# Patient Record
Sex: Female | Born: 1945 | Race: White | Hispanic: No
Health system: Southern US, Community
[De-identification: ages and names within clinical notes are randomized; demographics above are authoritative.]

---

## 2021-10-07 ENCOUNTER — Other Ambulatory Visit: Payer: Self-pay

## 2021-10-07 ENCOUNTER — Encounter (HOSPITAL_COMMUNITY): Payer: Self-pay | Admitting: Emergency Medicine

## 2021-10-07 ENCOUNTER — Emergency Department (HOSPITAL_COMMUNITY): Payer: Medicare Other

## 2021-10-07 ENCOUNTER — Inpatient Hospital Stay (HOSPITAL_COMMUNITY): Payer: Medicare Other

## 2021-10-07 ENCOUNTER — Inpatient Hospital Stay (HOSPITAL_COMMUNITY)
Admission: EM | Admit: 2021-10-07 | Discharge: 2021-10-09 | DRG: 880 | Disposition: A | Discharge status: Home / Self Care | Payer: Medicare Other | Attending: Internal Medicine | Admitting: Internal Medicine

## 2021-10-07 DIAGNOSIS — Z8673 Personal history of transient ischemic attack (TIA), and cerebral infarction without residual deficits: Secondary | ICD-10-CM | POA: Diagnosis not present

## 2021-10-07 DIAGNOSIS — Z91013 Allergy to seafood: Secondary | ICD-10-CM

## 2021-10-07 DIAGNOSIS — Z91030 Bee allergy status: Secondary | ICD-10-CM | POA: Diagnosis not present

## 2021-10-07 DIAGNOSIS — Z87898 Personal history of other specified conditions: Secondary | ICD-10-CM

## 2021-10-07 DIAGNOSIS — K219 Gastro-esophageal reflux disease without esophagitis: Secondary | ICD-10-CM

## 2021-10-07 DIAGNOSIS — Z79899 Other long term (current) drug therapy: Secondary | ICD-10-CM | POA: Diagnosis not present

## 2021-10-07 DIAGNOSIS — I10 Essential (primary) hypertension: Secondary | ICD-10-CM | POA: Diagnosis present

## 2021-10-07 DIAGNOSIS — R55 Syncope and collapse: Secondary | ICD-10-CM | POA: Diagnosis present

## 2021-10-07 DIAGNOSIS — Z881 Allergy status to other antibiotic agents status: Secondary | ICD-10-CM | POA: Diagnosis not present

## 2021-10-07 DIAGNOSIS — E785 Hyperlipidemia, unspecified: Secondary | ICD-10-CM | POA: Diagnosis present

## 2021-10-07 DIAGNOSIS — Z20822 Contact with and (suspected) exposure to covid-19: Secondary | ICD-10-CM | POA: Diagnosis present

## 2021-10-07 DIAGNOSIS — K7460 Unspecified cirrhosis of liver: Secondary | ICD-10-CM | POA: Diagnosis present

## 2021-10-07 DIAGNOSIS — R4182 Altered mental status, unspecified: Principal | ICD-10-CM

## 2021-10-07 DIAGNOSIS — F449 Dissociative and conversion disorder, unspecified: Secondary | ICD-10-CM | POA: Diagnosis not present

## 2021-10-07 DIAGNOSIS — E039 Hypothyroidism, unspecified: Secondary | ICD-10-CM

## 2021-10-07 DIAGNOSIS — E119 Type 2 diabetes mellitus without complications: Secondary | ICD-10-CM | POA: Diagnosis present

## 2021-10-07 DIAGNOSIS — F445 Conversion disorder with seizures or convulsions: Principal | ICD-10-CM

## 2021-10-07 DIAGNOSIS — Z7984 Long term (current) use of oral hypoglycemic drugs: Secondary | ICD-10-CM | POA: Diagnosis not present

## 2021-10-07 DIAGNOSIS — G9341 Metabolic encephalopathy: Secondary | ICD-10-CM

## 2021-10-07 DIAGNOSIS — R569 Unspecified convulsions: Secondary | ICD-10-CM

## 2021-10-07 DIAGNOSIS — I2510 Atherosclerotic heart disease of native coronary artery without angina pectoris: Secondary | ICD-10-CM | POA: Diagnosis present

## 2021-10-07 DIAGNOSIS — Z9861 Coronary angioplasty status: Secondary | ICD-10-CM | POA: Diagnosis not present

## 2021-10-07 DIAGNOSIS — H409 Unspecified glaucoma: Secondary | ICD-10-CM | POA: Diagnosis present

## 2021-10-07 DIAGNOSIS — Z66 Do not resuscitate: Secondary | ICD-10-CM | POA: Diagnosis present

## 2021-10-07 DIAGNOSIS — F4321 Adjustment disorder with depressed mood: Secondary | ICD-10-CM | POA: Diagnosis not present

## 2021-10-07 DIAGNOSIS — J449 Chronic obstructive pulmonary disease, unspecified: Secondary | ICD-10-CM

## 2021-10-07 DIAGNOSIS — R441 Visual hallucinations: Secondary | ICD-10-CM | POA: Diagnosis not present

## 2021-10-07 DIAGNOSIS — Z86711 Personal history of pulmonary embolism: Secondary | ICD-10-CM

## 2021-10-07 DIAGNOSIS — Z794 Long term (current) use of insulin: Secondary | ICD-10-CM | POA: Diagnosis not present

## 2021-10-07 DIAGNOSIS — F319 Bipolar disorder, unspecified: Secondary | ICD-10-CM | POA: Diagnosis present

## 2021-10-07 HISTORY — DX: Essential (primary) hypertension: I10

## 2021-10-07 HISTORY — DX: Bipolar disorder, unspecified: F31.9

## 2021-10-07 HISTORY — DX: Chronic obstructive pulmonary disease, unspecified: J44.9

## 2021-10-07 HISTORY — DX: Unspecified cirrhosis of liver: K74.60

## 2021-10-07 HISTORY — DX: Personal history of pulmonary embolism: Z86.711

## 2021-10-07 HISTORY — DX: Hypothyroidism, unspecified: E03.9

## 2021-10-07 HISTORY — DX: Unspecified glaucoma: H40.9

## 2021-10-07 LAB — COMPREHENSIVE METABOLIC PANEL
ALT: 17 U/L (ref 0–44)
AST: 28 U/L (ref 15–41)
Albumin: 3.4 g/dL — ABNORMAL LOW (ref 3.5–5.0)
Alkaline Phosphatase: 65 U/L (ref 38–126)
Anion gap: 9 (ref 5–15)
BUN: 16 mg/dL (ref 8–23)
CO2: 25 mmol/L (ref 22–32)
Calcium: 9.2 mg/dL (ref 8.9–10.3)
Chloride: 107 mmol/L (ref 98–111)
Creatinine, Ser: 1.15 mg/dL — ABNORMAL HIGH (ref 0.44–1.00)
GFR, Estimated: 50 mL/min — ABNORMAL LOW (ref >60)
Glucose, Bld: 102 mg/dL — ABNORMAL HIGH (ref 70–99)
Potassium: 3.8 mmol/L (ref 3.5–5.1)
Sodium: 141 mmol/L (ref 135–145)
Total Bilirubin: 0.4 mg/dL (ref 0.3–1.2)
Total Protein: 6.6 g/dL (ref 6.5–8.1)

## 2021-10-07 LAB — GLUCOSE, CAPILLARY: Glucose-Capillary: 78 mg/dL (ref 70–99)

## 2021-10-07 LAB — URINALYSIS, ROUTINE W REFLEX MICROSCOPIC
Bilirubin Urine: NEGATIVE
Glucose, UA: 150 mg/dL — AB
Hgb urine dipstick: NEGATIVE
Ketones, ur: NEGATIVE mg/dL
Leukocytes,Ua: NEGATIVE
Nitrite: NEGATIVE
Protein, ur: NEGATIVE mg/dL
Specific Gravity, Urine: 1.009 (ref 1.005–1.030)
pH: 6 (ref 5.0–8.0)

## 2021-10-07 LAB — CBC WITH DIFFERENTIAL/PLATELET
Abs Immature Granulocytes: 0.03 10*3/uL (ref 0.00–0.07)
Basophils Absolute: 0 10*3/uL (ref 0.0–0.1)
Basophils Relative: 1 %
Eosinophils Absolute: 0.1 10*3/uL (ref 0.0–0.5)
Eosinophils Relative: 2 %
HCT: 38.2 % (ref 36.0–46.0)
Hemoglobin: 12.1 g/dL (ref 12.0–15.0)
Immature Granulocytes: 1 %
Lymphocytes Relative: 31 %
Lymphs Abs: 1.9 10*3/uL (ref 0.7–4.0)
MCH: 28.1 pg (ref 26.0–34.0)
MCHC: 31.7 g/dL (ref 30.0–36.0)
MCV: 88.8 fL (ref 80.0–100.0)
Monocytes Absolute: 0.3 10*3/uL (ref 0.1–1.0)
Monocytes Relative: 5 %
Neutro Abs: 3.9 10*3/uL (ref 1.7–7.7)
Neutrophils Relative %: 60 %
Platelets: 134 10*3/uL — ABNORMAL LOW (ref 150–400)
RBC: 4.3 MIL/uL (ref 3.87–5.11)
RDW: 14.8 % (ref 11.5–15.5)
WBC: 6.3 10*3/uL (ref 4.0–10.5)
nRBC: 0 % (ref 0.0–0.2)

## 2021-10-07 LAB — I-STAT VENOUS BLOOD GAS, ED
Acid-Base Excess: 2 mmol/L (ref 0.0–2.0)
Bicarbonate: 27.5 mmol/L (ref 20.0–28.0)
Calcium, Ion: 1.15 mmol/L (ref 1.15–1.40)
HCT: 40 % (ref 36.0–46.0)
Hemoglobin: 13.6 g/dL (ref 12.0–15.0)
O2 Saturation: 64 %
Potassium: 4.1 mmol/L (ref 3.5–5.1)
Sodium: 139 mmol/L (ref 135–145)
TCO2: 29 mmol/L (ref 22–32)
pCO2, Ven: 45.8 mmHg (ref 44–60)
pH, Ven: 7.386 (ref 7.25–7.43)
pO2, Ven: 34 mmHg (ref 32–45)

## 2021-10-07 LAB — RAPID URINE DRUG SCREEN, HOSP PERFORMED
Amphetamines: NOT DETECTED
Barbiturates: NOT DETECTED
Benzodiazepines: POSITIVE — AB
Cocaine: NOT DETECTED
Opiates: NOT DETECTED
Tetrahydrocannabinol: NOT DETECTED

## 2021-10-07 LAB — TROPONIN I (HIGH SENSITIVITY)
Troponin I (High Sensitivity): 5 ng/L (ref <18)
Troponin I (High Sensitivity): 7 ng/L (ref <18)

## 2021-10-07 LAB — CBG MONITORING, ED
Glucose-Capillary: 111 mg/dL — ABNORMAL HIGH (ref 70–99)
Glucose-Capillary: 145 mg/dL — ABNORMAL HIGH (ref 70–99)
Glucose-Capillary: 42 mg/dL — CL (ref 70–99)

## 2021-10-07 LAB — VITAMIN B12: Vitamin B-12: 250 pg/mL (ref 180–914)

## 2021-10-07 LAB — ETHANOL: Alcohol, Ethyl (B): <10 mg/dL (ref <10)

## 2021-10-07 LAB — AMMONIA: Ammonia: 14 umol/L (ref 9–35)

## 2021-10-07 LAB — RESP PANEL BY RT-PCR (FLU A&B, COVID) ARPGX2
Influenza A by PCR: NEGATIVE
Influenza B by PCR: NEGATIVE
SARS Coronavirus 2 by RT PCR: NEGATIVE

## 2021-10-07 LAB — TSH: TSH: 4.186 u[IU]/mL (ref 0.350–4.500)

## 2021-10-07 LAB — PROTIME-INR
INR: 1.1 (ref 0.8–1.2)
Prothrombin Time: 14.1 seconds (ref 11.4–15.2)

## 2021-10-07 MED ORDER — ONDANSETRON HCL 4 MG PO TABS: 4.0000 mg | ORAL_TABLET | Freq: Four times a day (QID) | ORAL | Status: DC | PRN

## 2021-10-07 MED ORDER — ENOXAPARIN SODIUM 40 MG/0.4ML IJ SOSY
40.0000 mg | PREFILLED_SYRINGE | Freq: Every day | INTRAMUSCULAR | Status: DC
  Administered 2021-10-07 – 2021-10-09 (×3): 40 mg via SUBCUTANEOUS
  Filled 2021-10-07 (×3): qty 0.4

## 2021-10-07 MED ORDER — SENNOSIDES-DOCUSATE SODIUM 8.6-50 MG PO TABS: 1.0000 | ORAL_TABLET | Freq: Every evening | ORAL | Status: DC | PRN

## 2021-10-07 MED ORDER — LEVETIRACETAM IN NACL 1500 MG/100ML IV SOLN
1500.0000 mg | Freq: Once | INTRAVENOUS | Status: AC
  Administered 2021-10-07: 1500 mg via INTRAVENOUS
  Filled 2021-10-07: qty 100

## 2021-10-07 MED ORDER — ONDANSETRON HCL 4 MG/2ML IJ SOLN: 4.0000 mg | Freq: Four times a day (QID) | INTRAMUSCULAR | Status: DC | PRN

## 2021-10-07 MED ORDER — BISACODYL 10 MG RE SUPP
10.0000 mg | Freq: Every day | RECTAL | Status: DC | PRN
Start: 2021-10-07 — End: 2021-10-09

## 2021-10-07 MED ORDER — PANTOPRAZOLE SODIUM 40 MG IV SOLR
40.0000 mg | Freq: Every day | INTRAVENOUS | Status: DC
  Administered 2021-10-07 – 2021-10-08 (×2): 40 mg via INTRAVENOUS
  Filled 2021-10-07 (×2): qty 10

## 2021-10-07 MED ORDER — IPRATROPIUM BROMIDE 0.02 % IN SOLN: 0.5000 mg | RESPIRATORY_TRACT | Status: DC | PRN

## 2021-10-07 MED ORDER — ACETAMINOPHEN 650 MG RE SUPP: 650.0000 mg | Freq: Four times a day (QID) | RECTAL | Status: DC | PRN

## 2021-10-07 MED ORDER — SODIUM CHLORIDE 0.9 % IV SOLN: INTRAVENOUS | Status: DC

## 2021-10-07 MED ORDER — METOPROLOL TARTRATE 5 MG/5ML IV SOLN: 5.0000 mg | Freq: Four times a day (QID) | INTRAVENOUS | Status: DC | PRN

## 2021-10-07 MED ORDER — DEXTROSE-NACL 5-0.9 % IV SOLN: INTRAVENOUS | Status: DC

## 2021-10-07 MED ORDER — ALBUTEROL SULFATE (2.5 MG/3ML) 0.083% IN NEBU: 2.5000 mg | INHALATION_SOLUTION | RESPIRATORY_TRACT | Status: DC | PRN

## 2021-10-07 MED ORDER — SODIUM CHLORIDE 0.9% FLUSH
3.0000 mL | Freq: Two times a day (BID) | INTRAVENOUS | Status: DC
  Administered 2021-10-07 – 2021-10-09 (×4): 3 mL via INTRAVENOUS

## 2021-10-07 MED ORDER — INSULIN ASPART 100 UNIT/ML IJ SOLN
0.0000 [IU] | INTRAMUSCULAR | Status: DC
  Administered 2021-10-08 – 2021-10-09 (×3): 1 [IU] via SUBCUTANEOUS

## 2021-10-07 MED ORDER — ACETAMINOPHEN 325 MG PO TABS: 650.0000 mg | ORAL_TABLET | Freq: Four times a day (QID) | ORAL | Status: DC | PRN

## 2021-10-07 MED ORDER — DEXTROSE 50 % IV SOLN
INTRAVENOUS | Status: AC
Start: 2021-10-07 — End: 2021-10-07
  Administered 2021-10-07: 50 mL
  Filled 2021-10-07: qty 50

## 2021-10-07 MED ORDER — LORAZEPAM 2 MG/ML IJ SOLN
INTRAMUSCULAR | Status: AC
  Administered 2021-10-07: 2 mg
  Filled 2021-10-07: qty 1

## 2021-10-07 NOTE — ED Notes (Signed)
Pt's CBG was 42, this RN gave an amp of D50. Admitting aware, will recheck cbg.  ?

## 2021-10-07 NOTE — H&P (Addendum)
?History and Physical  ? ? ?Allison Hartman [id number] DOB: [birth year] DOA: 10/07/2021 ? ?PCP: Norberto Sorenson, MD  ?Patient coming from: Home for her cardiologist office ? ?I have personally briefly reviewed patient's old medical records in [place] ? ?Chief Complaint: Syncope ? ?HPI: Allison Hartman is a 76 y.o. female with medical history significant of hypertension, coronary artery disease status post PCI to mid LAD, history of TIA/CVA, nonalcoholic cirrhosis, diabetes type 2, bipolar disorder, hypothyroidism, GERD who presents to the ED from cardiologist office with a syncopal episode via EMS.  Patient unresponsive and as such history obtained per ED physician.  Per EDP, it is noted that patient has had this episodes before.  At cardiologist office glucometer unavailable however patient given some glucose.  Orthostatics not checked.  Patient brought to the ED where patient noted to have seizure-like episodes of upper extremity tremors.  Neurology consulted.  Hospitalist consulted ? ?ED Course: Patient seen in the ED, noted to be unresponsive.  Patient noted to have some seizure-like activity with upper extremity tremors during evaluation in the ED received IV Keppra load.  Head CT done negative for any acute abnormalities.  Chest x-ray negative for any acute abnormalities.  Venous blood gas unremarkable.  Comprehensive metabolic profile with a glucose of 102, creatinine of 1.15, albumin of 3.4 otherwise was within normal limits.  High-sensitivity troponin negative at 5.  CBC unremarkable.  Influenza A/B PCR negative.  SARS coronavirus PCR negative.  Alcohol level < 10.  Patient received IV Keppra load neurology consulted who assessed patient.  Hospitalist called to admit patient for further evaluation and management. ? ?Review of Systems: As per HPI otherwise all other systems reviewed and are negative. ? ?Past Medical History:  ?Diagnosis Date  ? Bipolar disorder ([place]) 10/07/2021  ?  Cirrhosis, nonalcoholic ([place]) [id number]  ? COPD (chronic obstructive pulmonary disease) ([place]) 10/07/2021  ? Essential hypertension 10/07/2021  ? Glaucoma 10/07/2021  ? History of pulmonary embolism 10/07/2021  ? Hypothyroidism 10/07/2021  ? ? ?History reviewed. No pertinent surgical history. ? ?Social History ? has no history on file for tobacco use, alcohol use, and drug use. ? ?Allergies  ?Allergen Reactions  ? Bee Venom   ? Clam Shell   ? Ibuprofen Nausea Only  ? Shrimp (Diagnostic)   ? ? ?History reviewed. No pertinent family history. ? ?Unable to obtain due to patient's mental status ? ?Prior to Admission medications   ?Medication Sig Start Date End Date Taking? Authorizing Provider  ?gabapentin (NEURONTIN) 600 MG tablet Take 1,200 mg by mouth at bedtime.   Yes [provider]  ? ? ?Physical Exam: ?Vitals:  ? 10/07/21 1410 10/07/21 1415 10/07/21 1430 10/07/21 1445  ?BP: 137/60 139/62 (!) 140/59 132/62  ?Pulse: 73 78 74 82  ?Resp: 19 19 19  (!) 21  ?Temp:      ?TempSrc:      ?SpO2: 96% 96% 95% 96%  ? ? ?Constitutional: Unresponsive. ?Vitals:  ? 10/07/21 1410 10/07/21 1415 10/07/21 1430 10/07/21 1445  ?BP: 137/60 139/62 (!) 140/59 132/62  ?Pulse: 73 78 74 82  ?Resp: 19 19 19  (!) 21  ?Temp:      ?TempSrc:      ?SpO2: 96% 96% 95% 96%  ? ?Eyes: PERRL, lids and conjunctivae normal ?ENMT: Mucous membranes are moist. Posterior pharynx clear of any exudate or lesions.Normal dentition.  ?Neck: normal, supple, no masses, no thyromegaly ?Respiratory: clear to auscultation bilaterally anterior lung fields, no wheezing, no crackles. Normal respiratory  effort. No accessory muscle use.  ?Cardiovascular: Regular rate and rhythm, no murmurs / rubs / gallops. No extremity edema. 2+ pedal pulses. No carotid bruits.  ?Abdomen: no tenderness, no masses palpated. No hepatosplenomegaly. Bowel sounds positive.  ?Musculoskeletal: no clubbing / cyanosis. No joint deformity upper and lower extremities.  Unable to assess for  contractures or strength due to patient's mental status. ?Skin: no rashes, lesions, ulcers. No induration ?Neurologic: Patient unresponsive to both noxious and verbal stimuli.  Unable to assess sensation.  Unable to assess strength.  Unable to elicit a gag reflex with tongue depressor.  ?Psychiatric: Unable to assess due to mental status/unresponsiveness. ? ?Labs on Admission: I have personally reviewed following labs and imaging studies ? ?CBC: ?Recent Labs  ?Lab 10/07/21 ?1134 10/07/21 ?1206  ?WBC  --  6.3  ?NEUTROABS  --  3.9  ?HGB 13.6 12.1  ?HCT 40.0 38.2  ?MCV  --  88.8  ?PLT  --  134*  ? ? ?Basic Metabolic Panel: ?Recent Labs  ?Lab 10/07/21 ?1134 10/07/21 ?1206  ?NA 139 141  ?K 4.1 3.8  ?CL  --  107  ?CO2  --  25  ?GLUCOSE  --  102*  ?BUN  --  16  ?CREATININE  --  1.15*  ?CALCIUM  --  9.2  ? ? ?GFR: ?CrCl cannot be calculated (Unknown ideal weight.). ? ?Liver Function Tests: ?Recent Labs  ?Lab 10/07/21 ?1206  ?AST 28  ?ALT 17  ?ALKPHOS 65  ?BILITOT 0.4  ?PROT 6.6  ?ALBUMIN 3.4*  ? ? ?Urine analysis: ?No results found for: COLORURINE, APPEARANCEUR, [place], [place], [place], [place], [place], KETONESUR, PROTEINUR, [place], NITRITE, LEUKOCYTESUR ? ?Radiological Exams on Admission: ?CT Head Wo Contrast ? ?Result Date: 10/07/2021 ?CLINICAL DATA:  Mental status changes, loss of consciousness, syncopal episode EXAM: CT HEAD WITHOUT CONTRAST TECHNIQUE: Contiguous axial images were obtained from the base of the skull through the vertex without intravenous contrast. RADIATION DOSE REDUCTION: This exam was performed according to the departmental dose-optimization program which includes automated exposure control, adjustment of the mA and/or kV according to patient size and/or use of iterative reconstruction technique. COMPARISON:  None FINDINGS: Brain: Generalized atrophy. Normal ventricular morphology. No midline shift or mass effect. Small vessel chronic ischemic changes of deep cerebral white matter. No  intracranial hemorrhage, mass lesion, evidence of acute infarction, or extra-axial fluid collection. Vascular: Atherosclerotic calcification of internal carotid and vertebral arteries at skull base. No hyperdense vessels. Skull: Intact Sinuses/Orbits: Clear Other: N/A IMPRESSION: Atrophy with small vessel chronic ischemic changes of deep cerebral white matter. No acute intracranial abnormalities. Electronically Signed   By: Lavonia Dana M.D.   On: 10/07/2021 12:08  ? ?DG Chest Port 1 View ? ?Result Date: 10/07/2021 ?CLINICAL DATA:  Altered mental status. EXAM: PORTABLE CHEST 1 VIEW COMPARISON:  None. FINDINGS: Cardiac silhouette and mediastinal contours are within normal limits. Mild calcification within aortic arch. Mildly decreased lung volumes. Left cardiophrenic angle density may represent normal fat pad. Otherwise, the lungs are clear. No pleural effusion or pneumothorax. Minimal dextrocurvature of the midthoracic spine with mild multilevel degenerative disc changes. Status post right shoulder hemiarthroplasty. Two screw anchors overlie the left humeral head. Moderate superior left glenohumeral joint space narrowing. IMPRESSION: Mildly decreased lung volumes with left costophrenic angle density favored to represent a normal epicardial fat pad on limited single frontal view. No definite acute lung process. Electronically Signed   By: Yvonne Kendall M.D.   On: 10/07/2021 12:40  ? ?EEG adult ? ?Result Date:  10/07/2021 ?Lora Havens, MD     10/07/2021  2:41 PM Patient Name: Allison Hartman MRN: [id number] Epilepsy Attending: Lora Havens Referring Physician/Provider: Greta Doom, MD Date: 10/07/2021 Duration: 22.49 mins Patient history: 28 old female with an episode of syncope and loss of awareness.  EEG to evaluate for seizure. Level of alertness: Awake AEDs during EEG study: None Technical aspects: This EEG study was done with scalp electrodes positioned according to the 10-20 International system  of electrode placement. Electrical activity was acquired at a sampling rate of 500Hz  and reviewed with a high frequency filter of 70Hz  and a low frequency filter of 1Hz . EEG data were recorded continuously and digitally sto

## 2021-10-07 NOTE — Consult Note (Signed)
Neurology Consultation ?Reason for Consult: Concern for seizures ?Referring Physician: Gardner Candle ? ?CC: Concern for seizures ? ?History is obtained from: Friend ? ?HPI: Allison Hartman is a 76 y.o. female with a history of conversion disorder, bipolar disorder who presents with episodes concerning for seizure.  She has apparently been passing out repeatedly over the past few weeks.  The description of a passing out episode is that she will have her legs go out on her, and that she will remain unconscious for 20 to 30 minutes, followed by rapid return to baseline mental status.  No convulsions were seen with those episodes.  She went to her cardiologist office today, where she was seen to have seizure-like activity.  She was brought into the emergency department and continued having seizure-like activity and she was given 2 mg of Ativan and Keppra.  She continued to have activity and neurology was consulted. ? ? ? ? ?ROS: Unable to obtain due to altered mental status. ? ?Past Medical History:  ?Diagnosis Date  ? Bipolar disorder ([facility]) 10/07/2021  ? Cirrhosis, nonalcoholic ([facility]) 10/07/2021  ? COPD (chronic obstructive pulmonary disease) ([facility]) 10/07/2021  ? Essential hypertension 10/07/2021  ? Glaucoma 10/07/2021  ? History of pulmonary embolism 10/07/2021  ? Hypothyroidism 10/07/2021  ? ? ? ?History reviewed. No pertinent family history. ? ? ?Social History:  has no history on file for tobacco use, alcohol use, and drug use. ? ? ?Exam: ?Current vital signs: ?BP (!) 154/62   Pulse 72   Temp 98 ?F (36.7 ?C) (Oral)   Resp 19   SpO2 96%  ?Vital signs in last 24 hours: ?Temp:  [98 ?F (36.7 ?C)] 98 ?F (36.7 ?C) (04/27 1116) ?Pulse Rate:  [72-82] 72 (04/27 1545) ?Resp:  [11-21] 19 (04/27 1545) ?BP: (105-171)/(59-91) 154/62 (04/27 1545) ?SpO2:  [95 %-99 %] 96 % (04/27 1545) ? ? ?Physical Exam  ?Constitutional: Appears well-developed and well-nourished.  ?Psych: Affect appropriate to situation ?Eyes: No scleral  injection ?HENT: No OP obstruction ?MSK: no joint deformities.  ?Cardiovascular: Normal rate and regular rhythm.  ?Respiratory: Effort normal, non-labored breathing ?GI: Soft.  No distension. There is no tenderness.  ?Skin: WDI ? ?Neuro: ?Mental Status: ?Patient does not open eyes, though when her eyes are propped open, optokinetic nystagmus can be elicited.  She does not follow commands. ?Cranial Nerves: ?II: Optokinetic nystagmus can be elicited with a strip. Pupils are equal, round, and reactive to light.   ?III,IV, VI: Eyes are midline, she actively resists.  ?VII: Facial movement is symmetric.  ?Motor: ?She has normal tone, her extremities are Flaccid, however initially when I check if her arm will drop onto her face she avoids her face, but then subsequently allows her to drop onto her face though mitigated.  She has several episodes of bilateral arm repetitive extension, these movements are aborted with repositioning or noxious stimulation. ?Sensory: ?She has no response to noxious stimulation in any extremity, other than aborting her spell.   ?Cerebellar: ?Does not perform ? ? ? ? ?I have reviewed labs in epic and the results pertinent to this consultation are: ?CBG 111 ? ?I have reviewed the images obtained: CT head-negative ? ?EEG-no change during episodes. ? ?Impression: 76 year old female with repetitive episodes with the semiology concerning for possible seizure.  She has a history in the chart of conversion disorder, though I am not certain how this manifested.  Certainly, the episodes that she is presenting with today though have the appearance of convulsion, coupled  with a normal-appearing EEG, consistent with psychogenic nonepileptic spells.  We can continue continuous EEG for now, because I think otherwise the question of these episodes to be repeatedly raised overnight. ? ?Recommendations: ?1) stat EEG was initially requested, this was finalized at the time of dictation dictation. ?2) I would  continue LTM EEG to capture more spells overnight.  If no true seizures, then can discontinue it tomorrow. ?3) May need psychiatric consultation given frequent ? ?Ritta Slot, MD ?[facility] ?[phone] ? ?If 7pm- 7am, please page neurology on call as listed in AMION. ? ?

## 2021-10-07 NOTE — ED Triage Notes (Signed)
Pt here via [facility] fromn cardiology office. Pt was speaking to staff and had syncopal episode, no fall, pt had no complaints prior to syncope. Pt has been only responsive to pain for EMS, not speaking. VSS. Cardiology office gave her D10 in case cbg was low, office did not have a glucometer to check cbg. With EMS cbg 208 ?

## 2021-10-07 NOTE — ED Notes (Signed)
Patient transported to CT w/ RN. Upon return to pt's room, pt had 2 more episodes of upper body shaking, pt's HR increased to 205, 97% on RA, pt unresponsive to pain. Episodes last approx 30 seconds, and occur every 1 to 5 min. ?

## 2021-10-07 NOTE — ED Notes (Signed)
RN at bedside attempting to gain IV access. Pt began having bilateral tremors of the upper arms and torso, lasted approx 30 second. Opt HR increased to 170s, SpO2 remained 99%, no vomitus or incontinence. ?

## 2021-10-07 NOTE — ED Notes (Signed)
EEG tech at bedside. 

## 2021-10-07 NOTE — ED Notes (Signed)
In the last 4 minutes pt hs had 5 separate episodes of bilateral upper body shaking, HR increased to 170s , SpO2 remained 99% on RA. Keppra started, pharmacy at bedsides ?

## 2021-10-07 NOTE — ED Notes (Signed)
Patient transported to CT 

## 2021-10-07 NOTE — ED Provider Notes (Signed)
?[facility] ?Provider Note ? ? ?CSN: [id number] ?Arrival date & time: 10/07/21  1107 ? ?  ? ?History ? ?Chief Complaint  ?Patient presents with  ? Loss of Consciousness  ? Altered Mental Status  ? ? ?Bernarda CaffeyBarbara Alice Tucker is a 76 y.o. female.  She has brought in from cardiologist office for a syncopal event by EMS.  Per EMS she has had these episodes before.  She has been unresponsive for about 45 minutes.  Patient unable to give any other history at this time level 5 caveat.  Reportedly is a DNR. ? ?The history is provided by the EMS personnel.  ?Loss of Consciousness ?Episode history:  Single ?Most recent episode:  Today ?Duration:  45 minutes ?Timing:  Constant ?Progression:  Unchanged ?Chronicity:  Recurrent ?Relieved by:  Nothing ?Worsened by:  Nothing ?Ineffective treatments:  None tried ?Altered Mental Status ? ?  ? ?Home Medications ?Prior to Admission medications   ?Not on File  ?   ? ?Allergies    ?Bee venom, Clam shell, and Shrimp (diagnostic)   ? ?Review of Systems   ?Review of Systems  ?Unable to perform ROS: Mental status change  ?Cardiovascular:  Positive for syncope.  ? ?Physical Exam ?Updated Vital Signs ?BP (!) 107/56   Pulse 78   Temp 98 ?F (36.7 ?C) (Oral)   Resp 17   SpO2 94%  ?Physical Exam ?Vitals and nursing note reviewed.  ?Constitutional:   ?   General: She is not in acute distress. ?   Appearance: Normal appearance. She is well-developed. She is obese.  ?HENT:  ?   Head: Normocephalic and atraumatic.  ?Eyes:  ?   Conjunctiva/sclera: Conjunctivae normal.  ?Cardiovascular:  ?   Rate and Rhythm: Normal rate and regular rhythm.  ?   Heart sounds: No murmur heard. ?Pulmonary:  ?   Effort: Pulmonary effort is normal. No respiratory distress.  ?   Breath sounds: Normal breath sounds.  ?Abdominal:  ?   Palpations: Abdomen is soft.  ?   Tenderness: There is no abdominal tenderness. There is no guarding or rebound.  ?Musculoskeletal:     ?   General: No  swelling.  ?   Cervical back: Neck supple.  ?Skin: ?   General: Skin is warm and dry.  ?   Capillary Refill: Capillary refill takes less than 2 seconds.  ?Neurological:  ?   Comments: Patient is unarousable to voice or stimulation.  She is not following any neurologic exam.  ? ? ?ED Results / Procedures / Treatments   ?Labs ?(all labs ordered are listed, but only abnormal results are displayed) ?Labs Reviewed  ?CBC WITH DIFFERENTIAL/PLATELET - Abnormal; Notable for the following components:  ?    Result Value  ? Platelets 134 (*)   ? All other components within normal limits  ?COMPREHENSIVE METABOLIC PANEL - Abnormal; Notable for the following components:  ? Glucose, Bld 102 (*)   ? Creatinine, Ser 1.15 (*)   ? Albumin 3.4 (*)   ? GFR, Estimated 50 (*)   ? All other components within normal limits  ?URINALYSIS, ROUTINE W REFLEX MICROSCOPIC - Abnormal; Notable for the following components:  ? Color, Urine STRAW (*)   ? Glucose, UA 150 (*)   ? All other components within normal limits  ?RAPID URINE DRUG SCREEN, HOSP PERFORMED - Abnormal; Notable for the following components:  ? Benzodiazepines POSITIVE (*)   ? All other components within normal limits  ?CBG MONITORING,  ED - Abnormal; Notable for the following components:  ? Glucose-Capillary 111 (*)   ? All other components within normal limits  ?CBG MONITORING, ED - Abnormal; Notable for the following components:  ? Glucose-Capillary 42 (*)   ? All other components within normal limits  ?CBG MONITORING, ED - Abnormal; Notable for the following components:  ? Glucose-Capillary 145 (*)   ? All other components within normal limits  ?RESP PANEL BY RT-PCR (FLU A&B, COVID) [id number]  ?URINE CULTURE  ?PROTIME-INR  ?ETHANOL  ?AMMONIA  ?URINALYSIS, ROUTINE W REFLEX MICROSCOPIC  ?TSH  ?VITAMIN B12  ?MAGNESIUM  ?TSH  ?HEMOGLOBIN A1C  ?COMPREHENSIVE METABOLIC PANEL  ?CBC  ?I-STAT VENOUS BLOOD GAS, ED  ?TROPONIN I (HIGH SENSITIVITY)  ?TROPONIN I (HIGH SENSITIVITY)  ? ? ?EKG ?EKG  Interpretation ? ?Date/Time:  Thursday October 07 2021 11:14:18 EDT ?Ventricular Rate:  82 ?PR Interval:  159 ?QRS Duration: 92 ?QT Interval:  372 ?QTC Calculation: 435 ?R Axis:   52 ?Text Interpretation: Sinus rhythm Consider left atrial enlargement Abnormal R-wave progression, early transition No old tracing to compare Confirmed by Meridee Score [phone]) on 10/07/2021 11:19:08 AM ? ?Radiology ?CT Head Wo Contrast ? ?Result Date: 10/07/2021 ?CLINICAL DATA:  Mental status changes, loss of consciousness, syncopal episode EXAM: CT HEAD WITHOUT CONTRAST TECHNIQUE: Contiguous axial images were obtained from the base of the skull through the vertex without intravenous contrast. RADIATION DOSE REDUCTION: This exam was performed according to the departmental dose-optimization program which includes automated exposure control, adjustment of the mA and/or kV according to patient size and/or use of iterative reconstruction technique. COMPARISON:  None FINDINGS: Brain: Generalized atrophy. Normal ventricular morphology. No midline shift or mass effect. Small vessel chronic ischemic changes of deep cerebral white matter. No intracranial hemorrhage, mass lesion, evidence of acute infarction, or extra-axial fluid collection. Vascular: Atherosclerotic calcification of internal carotid and vertebral arteries at skull base. No hyperdense vessels. Skull: Intact Sinuses/Orbits: Clear Other: N/A IMPRESSION: Atrophy with small vessel chronic ischemic changes of deep cerebral white matter. No acute intracranial abnormalities. Electronically Signed   By: Ulyses Southward M.D.   On: 10/07/2021 12:08  ? ?DG Chest Port 1 View ? ?Result Date: 10/07/2021 ?CLINICAL DATA:  Altered mental status. EXAM: PORTABLE CHEST 1 VIEW COMPARISON:  None. FINDINGS: Cardiac silhouette and mediastinal contours are within normal limits. Mild calcification within aortic arch. Mildly decreased lung volumes. Left cardiophrenic angle density may represent normal fat pad.  Otherwise, the lungs are clear. No pleural effusion or pneumothorax. Minimal dextrocurvature of the midthoracic spine with mild multilevel degenerative disc changes. Status post right shoulder hemiarthroplasty. Two screw anchors overlie the left humeral head. Moderate superior left glenohumeral joint space narrowing. IMPRESSION: Mildly decreased lung volumes with left costophrenic angle density favored to represent a normal epicardial fat pad on limited single frontal view. No definite acute lung process. Electronically Signed   By: Neita Garnet M.D.   On: 10/07/2021 12:40  ? ?EEG adult ? ?Result Date: 10/07/2021 ?Charlsie Quest, MD     10/07/2021  2:41 PM Patient Name: Calais Svehla MRN: [id number] Epilepsy Attending: Charlsie Quest Referring Physician/Provider: Rejeana Brock, MD Date: 10/07/2021 Duration: 22.49 mins Patient history: 76 old female with an episode of syncope and loss of awareness.  EEG to evaluate for seizure. Level of alertness: Awake AEDs during EEG study: None Technical aspects: This EEG study was done with scalp electrodes positioned according to the 10-20 International system of electrode placement. Electrical activity was acquired at  a sampling rate of 500Hz  and reviewed with a high frequency filter of 70Hz  and a low frequency filter of 1Hz . EEG data were recorded continuously and digitally stored. Description: The posterior dominant rhythm consists of 8Hz  activity of moderate voltage (25-35 uV) seen predominantly in posterior head regions, symmetric and reactive to eye opening and eye closing. Event button was pressed on 10/07/2021 at 1413.  Patient was laying in bed had full body jerking, predominantly bilateral upper extremity, eyes were closed head shaking from side to side.  Event lasted for about 20 seconds.  Concomitant EEG before, during and after  did not show any EEG changes suggest seizure. Hyperventilation and photic stimulation were not performed.   IMPRESSION: This  study is within normal limits. No seizures or epileptiform discharges were seen throughout the recording. Patient event was recorded on 10/07/2021 at 1413 as described above without concomitant EEG change.  This wa

## 2021-10-07 NOTE — Procedures (Signed)
Patient Name: Allison Hartman  ?MRN: [id number]  ?Epilepsy Attending: Charlsie Quest  ?Referring Physician/Provider: Rejeana Brock, MD ?Date: 10/07/2021 ?Duration: 22.49 mins ? ?Patient history: 18 old female with an episode of syncope and loss of awareness.  EEG to evaluate for seizure. ? ?Level of alertness: Awake ? ?AEDs during EEG study: None ? ?Technical aspects: This EEG study was done with scalp electrodes positioned according to the 10-20 International system of electrode placement. Electrical activity was acquired at a sampling rate of 500Hz  and reviewed with a high frequency filter of 70Hz  and a low frequency filter of 1Hz . EEG data were recorded continuously and digitally stored.  ? ?Description: The posterior dominant rhythm consists of 8Hz  activity of moderate voltage (25-35 uV) seen predominantly in posterior head regions, symmetric and reactive to eye opening and eye closing.  ? ?Event button was pressed on 10/07/2021 at 1413.  Patient was laying in bed had full body jerking, predominantly bilateral upper extremity, eyes were closed head shaking from side to side.  Event lasted for about 20 seconds.  Concomitant EEG before, during and after  did not show any EEG changes suggest seizure. ? ?Hyperventilation and photic stimulation were not performed.    ? ?IMPRESSION: ?This study is within normal limits. No seizures or epileptiform discharges were seen throughout the recording. ? ?Patient event was recorded on 10/07/2021 at 1413 as described above without concomitant EEG change.  This was a non-epileptic event ? ? ?Yechezkel Fertig  ? ?

## 2021-10-08 DIAGNOSIS — F445 Conversion disorder with seizures or convulsions: Secondary | ICD-10-CM | POA: Diagnosis not present

## 2021-10-08 DIAGNOSIS — R441 Visual hallucinations: Secondary | ICD-10-CM

## 2021-10-08 DIAGNOSIS — Z87898 Personal history of other specified conditions: Secondary | ICD-10-CM

## 2021-10-08 DIAGNOSIS — F319 Bipolar disorder, unspecified: Secondary | ICD-10-CM | POA: Diagnosis not present

## 2021-10-08 DIAGNOSIS — E039 Hypothyroidism, unspecified: Secondary | ICD-10-CM

## 2021-10-08 DIAGNOSIS — R55 Syncope and collapse: Secondary | ICD-10-CM | POA: Diagnosis not present

## 2021-10-08 DIAGNOSIS — F4321 Adjustment disorder with depressed mood: Secondary | ICD-10-CM

## 2021-10-08 DIAGNOSIS — R569 Unspecified convulsions: Secondary | ICD-10-CM

## 2021-10-08 DIAGNOSIS — G9341 Metabolic encephalopathy: Secondary | ICD-10-CM | POA: Diagnosis not present

## 2021-10-08 DIAGNOSIS — J449 Chronic obstructive pulmonary disease, unspecified: Secondary | ICD-10-CM | POA: Diagnosis not present

## 2021-10-08 DIAGNOSIS — F449 Dissociative and conversion disorder, unspecified: Secondary | ICD-10-CM | POA: Diagnosis not present

## 2021-10-08 LAB — GLUCOSE, CAPILLARY
Glucose-Capillary: 103 mg/dL — ABNORMAL HIGH (ref 70–99)
Glucose-Capillary: 118 mg/dL — ABNORMAL HIGH (ref 70–99)
Glucose-Capillary: 148 mg/dL — ABNORMAL HIGH (ref 70–99)
Glucose-Capillary: 88 mg/dL (ref 70–99)
Glucose-Capillary: 93 mg/dL (ref 70–99)
Glucose-Capillary: 98 mg/dL (ref 70–99)

## 2021-10-08 LAB — COMPREHENSIVE METABOLIC PANEL
ALT: 19 U/L (ref 0–44)
AST: 29 U/L (ref 15–41)
Albumin: 2.8 g/dL — ABNORMAL LOW (ref 3.5–5.0)
Alkaline Phosphatase: 61 U/L (ref 38–126)
Anion gap: 3 — ABNORMAL LOW (ref 5–15)
BUN: 14 mg/dL (ref 8–23)
CO2: 25 mmol/L (ref 22–32)
Calcium: 8.5 mg/dL — ABNORMAL LOW (ref 8.9–10.3)
Chloride: 113 mmol/L — ABNORMAL HIGH (ref 98–111)
Creatinine, Ser: 1.13 mg/dL — ABNORMAL HIGH (ref 0.44–1.00)
GFR, Estimated: 51 mL/min — ABNORMAL LOW (ref >60)
Glucose, Bld: 93 mg/dL (ref 70–99)
Potassium: 4.1 mmol/L (ref 3.5–5.1)
Sodium: 141 mmol/L (ref 135–145)
Total Bilirubin: 0.4 mg/dL (ref 0.3–1.2)
Total Protein: 5.3 g/dL — ABNORMAL LOW (ref 6.5–8.1)

## 2021-10-08 LAB — URINE CULTURE: Culture: NO GROWTH

## 2021-10-08 LAB — CBC
HCT: 35.1 % — ABNORMAL LOW (ref 36.0–46.0)
Hemoglobin: 11.3 g/dL — ABNORMAL LOW (ref 12.0–15.0)
MCH: 27.9 pg (ref 26.0–34.0)
MCHC: 32.2 g/dL (ref 30.0–36.0)
MCV: 86.7 fL (ref 80.0–100.0)
Platelets: 125 10*3/uL — ABNORMAL LOW (ref 150–400)
RBC: 4.05 MIL/uL (ref 3.87–5.11)
RDW: 15 % (ref 11.5–15.5)
WBC: 5.4 10*3/uL (ref 4.0–10.5)
nRBC: 0 % (ref 0.0–0.2)

## 2021-10-08 LAB — TSH: TSH: 9.189 u[IU]/mL — ABNORMAL HIGH (ref 0.350–4.500)

## 2021-10-08 LAB — HEMOGLOBIN A1C
Hgb A1c MFr Bld: 6.7 % — ABNORMAL HIGH (ref 4.8–5.6)
Mean Plasma Glucose: 145.59 mg/dL

## 2021-10-08 LAB — MAGNESIUM: Magnesium: 1.7 mg/dL (ref 1.7–2.4)

## 2021-10-08 LAB — T4, FREE: Free T4: 0.93 ng/dL (ref 0.61–1.12)

## 2021-10-08 MED ORDER — LORAZEPAM 0.5 MG PO TABS
0.5000 mg | ORAL_TABLET | Freq: Every day | ORAL | Status: DC
  Administered 2021-10-09: 0.5 mg via ORAL
  Filled 2021-10-08: qty 1

## 2021-10-08 MED ORDER — FAMOTIDINE 20 MG PO TABS
40.0000 mg | ORAL_TABLET | Freq: Every day | ORAL | Status: DC
  Administered 2021-10-08: 40 mg via ORAL
  Filled 2021-10-08: qty 2

## 2021-10-08 MED ORDER — LIVING WELL WITH DIABETES BOOK
Freq: Once | Status: AC
  Filled 2021-10-08: qty 1

## 2021-10-08 MED ORDER — SODIUM CHLORIDE 0.9 % IV SOLN: INTRAVENOUS | Status: DC

## 2021-10-08 MED ORDER — LORAZEPAM 1 MG PO TABS
2.0000 mg | ORAL_TABLET | Freq: Every day | ORAL | Status: DC
  Administered 2021-10-09: 2 mg via ORAL
  Filled 2021-10-08: qty 2

## 2021-10-08 MED ORDER — SPIRONOLACTONE 25 MG PO TABS
25.0000 mg | ORAL_TABLET | Freq: Every day | ORAL | Status: DC
  Administered 2021-10-09: 25 mg via ORAL
  Filled 2021-10-08: qty 1

## 2021-10-08 MED ORDER — LEVOTHYROXINE SODIUM 88 MCG PO TABS
88.0000 ug | ORAL_TABLET | Freq: Every morning | ORAL | Status: DC
  Filled 2021-10-08: qty 1

## 2021-10-08 MED ORDER — PANTOPRAZOLE SODIUM 40 MG PO TBEC
40.0000 mg | DELAYED_RELEASE_TABLET | Freq: Every day | ORAL | Status: DC
  Administered 2021-10-09: 40 mg via ORAL
  Filled 2021-10-08: qty 1

## 2021-10-08 MED ORDER — LORAZEPAM 0.5 MG PO TABS: 0.5000 mg | ORAL_TABLET | Freq: Four times a day (QID) | ORAL | Status: DC

## 2021-10-08 MED ORDER — PANTOPRAZOLE SODIUM 40 MG PO TBEC: 40.0000 mg | DELAYED_RELEASE_TABLET | Freq: Every day | ORAL | Status: DC

## 2021-10-08 NOTE — Progress Notes (Addendum)
NEUROLOGY CONSULTATION PROGRESS NOTE  ? ?Date of service: October 08, 2021 ?Patient Name: Allison Hartman ?MRN:  [id number] ?DOB:  14-May-1946 ? ?Brief HPI  ?Allison Hartman is a 76 y.o. female with a history of conversion disorder, bipolar disorder who presents with episodes concerning for seizure.  She has apparently been passing out repeatedly over the past few weeks.  The description of a passing out episode is that she will have her legs go out on her, and that she will remain unconscious for 20 to 30 minutes, followed by rapid return to baseline mental status.  No convulsions were seen with those episodes.  She went to her cardiologist office today, where she was seen to have seizure-like activity.  She was brought into the emergency department and continued having seizure-like activity and she was given 2 mg of Ativan and Keppra.  She continued to have activity and neurology was consulted. ?  ?Interval Hx  ? ?Friend Allison Hartman at bedside.  ? ?No seizure episodes overnight and patient feels like she is ready to go home. Discussed with her and friend that her seizure-like episodes are neuro-functional likely in the setting of trauma such as daughter's death 07-28-22husband's death 5 years ago, recent diagnosis of diabetes and likely other given that during her seizure-like episodes there was no correlation on EEG. Patient and friend received info well. ?Otherwise no other concerns.  ? ?Vitals  ? ?Vitals:  ? 10/07/21 2104 10/08/21 0000 10/08/21 0432 10/08/21 9675  ?BP: (!) 127/57 (!) 128/57 (!) 122/53 108/70  ?Pulse: 79 79 77 74  ?Resp: 18 18 18 18   ?Temp: 98.1 ?F (36.7 ?C) 98.7 ?F (37.1 ?C) 97.8 ?F (36.6 ?C) 98 ?F (36.7 ?C)  ?TempSrc: Oral Axillary Axillary Oral  ?SpO2: 92% 94% 94% 93%  ?Weight:   74.1 kg   ?  ? ?There is no height or weight on file to calculate BMI. ? ?Physical Exam  ?General: Laying comfortably in bed; in no acute distress.  ?Pulmonary: Symmetric chest rise. Non-labored respiratory effort.   ?Ext: No cyanosis or deformity  ?Musculoskeletal: Normal digits and nails by inspection. No clubbing.  ? ?Neurologic Examination  ?Mental status/Cognition:  ?Alert. Oriented to self, place, month and year ?Good attention.  ?Speech/language:  ?Fluent, thought content appropriate.  ?Comprehension intact-able to follow 3 step commands without difficulty. ,  ?Object naming intact, repetition intact.    ? ?Cranial Nerves: ?II: No VF deficits.  ?III,IV, VI: Ptosis not present, extra-ocular motions intact bilaterally. pupils equal, round, reactive to light and accommodation, no gaze preference or deviation. no nystagmus  ?V: facial light touch sensation normal bilaterally ?VII: no facial droop at rest or smile.  ?VIII: Age appropriate hearing difficulty in right ear ?IX,X: cough intact. uvula rises symmetrically.  ?XI: bilateral shoulder shrug and head turn ?XII: midline tongue extension ?Motor: ?No drifts in all 4 limbs.  ?R  UE 4+/5 LE 5/5  ?L UE 4+/5 LE 5/5  ?normal tone throughout. normal bulk throughout. no atrophy noted ? ?Sensory: Pinprick and light touch intact throughout.  ? ?Deep Tendon Reflexes: 1+ and symmetric throughout ? ?Coordination/Complex Motor:  ?- Finger to Nose intact ?- Rapid alternating movement intact ?- Heel to shin intact ?- Gait: deferred ? ?Labs  ? ?Basic Metabolic Panel:  ?Lab Results  ?Component Value Date  ? NA 141 10/08/2021  ? K 4.1 10/08/2021  ? CO2 25 10/08/2021  ? GLUCOSE 93 10/08/2021  ? BUN 14 10/08/2021  ? CREATININE 1.13 (  H) 10/08/2021  ? CALCIUM 8.5 (L) 10/08/2021  ? GFRNONAA 51 (L) 10/08/2021  ? ?HbA1c:  ?Lab Results  ?Component Value Date  ? HGBA1C 6.7 (H) 10/08/2021  ? ?LDL: No results found for: LDLCALC ?Urine Drug Screen:  ?   ?Component Value Date/Time  ? LABOPIA NONE DETECTED 10/07/2021 1530  ? COCAINSCRNUR NONE DETECTED 10/07/2021 1530  ? LABBENZ POSITIVE (A) 10/07/2021 1530  ? AMPHETMU NONE DETECTED 10/07/2021 1530  ? THCU NONE DETECTED 10/07/2021 1530  ? LABBARB NONE  DETECTED 10/07/2021 1530  ?  ?Alcohol Level  ?   ?Component Value Date/Time  ? ETH <10 10/07/2021 1206  ? ?No results found for: PHENYTOIN, ZONISAMIDE, LAMOTRIGINE, LEVETIRACETA ?No results found for: PHENYTOIN, PHENOBARB, VALPROATE, CBMZ ? ?Imaging and Diagnostic studies  ?Results for orders placed during the hospital encounter of 10/07/21 ? ?CT Head Wo Contrast ?Atrophy with small vessel chronic ischemic changes of deep cerebral white matter. No acute intracranial abnormalities. ?On: 10/07/2021 12:08 ? ?EEG routine: ?This study is within normal limits. No seizures or epileptiform discharges were seen throughout the recording. ?Patient event was recorded on 10/07/2021 at 1413 as described above without concomitant EEG change.  This was a non-epileptic event ?10/07/2021 ? ?EEG Overnight: ?This study is within normal limits. No seizures or epileptiform discharges were seen throughout the recording. ?Multiple events were recorded on 10/07/2021 between 1434 to 1530 as described above without concomitant EEG change.  These events were non-epileptic. ?10/07/2021 1434 to 10/08/2021 1100 ?Impression  ? ?Allison Hartman is a 76 y.o. female with PMH significant for conversion disorder, bipolar disorder who presents with episodes concerning for seizure. ? ?She has a history in the chart of conversion disorder, though I am not certain how this manifested.  Certainly, the episodes that she is presenting with today though have the appearance of convulsion, coupled with a normal-appearing EEG, consistent with psychogenic nonepileptic spells.  We can continue continuous EEG for now, because I think otherwise the question of these episodes to be repeatedly raised overnight.  ? ?Next day, overnight EEG showed no seizures, which was discussed with patient and friend at bedside along with her episodes are neuro-functional seizures. Both received info well. Discussed with primary concern for intentional misuse of insulin to induce  hypoglycemia as CBG on arrival was 40s. Considering secondary gain vs SI. Possible secondary gain and med seeking because PDMP shows she receives ativan 1 mg QID for 3 months, last dispense was 08/06/2021. Psychiatry was consulted for SI concern. ? ?Given no seizures, will be signing off.  ? ?Recommendations  ?- D/c overnight EEG - no seizures overnight ?______________________________________________________________________ ? ? ?Thank you for the opportunity to take part in the care of this patient. If you have any further questions, please contact the neurology consultation attending. ? ?Signed: ?Princess Bruins, DO ?Resident, PGY-1 ?[facility] ?10/08/2021, 9:30 AM   ? ?ATTENDING NOTE: ?I reviewed above note and agree with the assessment and plan. Pt was seen and examined.  ? ?LTM EEG overnight captured more events, consider with nonepileptic episodes. Discussed with pt about conversion disorder and she expressed understanding. Recommend outpt psychiatry and psychology follow-up. ? ?For detailed assessment and plan, please refer to above as I have made changes wherever appropriate.  ? ?Neurology will sign off. Please call with questions. Thanks for the consult. ? ?Marvel Plan, MD PhD ?Stroke Neurology ?10/08/2021 ?9:11 PM ? ? ?

## 2021-10-08 NOTE — Progress Notes (Signed)
?PROGRESS NOTE ? ? ? ?Allison Hartman  [id number] DOB: [birth year] DOA: 10/07/2021 ?PCP: Norberto Sorenson, MD  ? ? ?Chief Complaint  ?Patient presents with  ? Loss of Consciousness  ? Altered Mental Status  ? ? ?Brief Narrative:  ?Patient 76 year old female history of hypertension, CAD status post PCI to mid LAD, history of TIA/CVA, type 2 diabetes, bipolar disorder, hypothyroidism presented to ED from cardiologist office with a syncopal episode with seizure-like symptoms.  Also concern for hypoglycemic episodes as well as possible pseudoseizures.  Patient admitted placed on continuous EEG, neurology consulted and following.  Psychiatry consulted. ? ? ?Assessment & Plan: ?  ?Principal Problem: ?  Syncope ?Active Problems: ?  Conversion disorder ?  Acute metabolic encephalopathy ?  Diabetes mellitus type 2, uncomplicated ([place]) ?  Coronary artery disease: Status post PCI to mid LAD 11/2019 ?  GERD (gastroesophageal reflux disease) ?  Cirrhosis, nonalcoholic ([place]) ?  Essential hypertension ?  COPD (chronic obstructive pulmonary disease) ([place]) ?  Bipolar disorder ([place]) ?  Glaucoma ?  Hypothyroidism ?  History of pulmonary embolism ?  History of psychogenic nonepileptic seizure ? ?#1 syncope/seizure-like activity/probable conversion disorder/pseudoseizures/psychogenic nonepileptic spells in the setting of hypoglycemic spells. ?-Patient presented with syncopal episode from cardiologist office. ?-CBG not obtained however patient did receive some glucose. ?-Likely multifactorial in the setting of probable hypoglycemic spells, psychogenic nonepileptic spells, concern for hypoglycemic spells.  Patient also noted to have experienced recent death of her daughter a few months ago as well as her husband a year prior.  Also concern for secondary gain. ?-Patient on presentation unresponsive to both noxious and verbal stimuli however protecting airway. ?-Unable to obtain orthostatics due to patient's unresponsiveness. ?-No  arrhythmias noted on telemetry or EKG. ?-Head CT done unremarkable. ?-Chest x-ray with no acute abnormalities. ?-Patient afebrile, no signs or symptoms of infection. ?-VBG unremarkable. ?-Patient was loaded with IV Keppra on presentation. ?-Patient underwent EEG on presentation as well as overnight EEG which did not show any seizures.   ?-Patient seen and followed by neurology during the hospitalization who explained to patient about psychogenic nonepileptic spells/neuro functional seizures.   ?-Patient alert, following commands.   ?-Patient noted to have CBGs in the 40s on presentation and currently on D5 normal saline  ?-Discontinue normal saline and placed patient on a carb modified diet.   ?-Continue CBGs every 4 hours.   ?-Patient being followed by neurology who have signed off.   ?-Patient also seen in consultation by psychiatry.   ?-Monitor for the next 24 hours, if no further episodes and CBGs remained stable patient could be discharged tomorrow.   ?-We will resume half home dose Ativan.  Per psychiatry patient on Ativan 1 mg every morning and 3 mg nightly ?-PT/OT.   ?-Follow. ? ?2.  GERD ?-Change IV PPI back to home dose oral PPI.  ? ?3.  Acute metabolic encephalopathy ?-Likely secondary to problem #1. ?-Patient with no signs or symptoms of infection. ?-See #1. ? ?4.  Hyperlipidemia ?-Outpatient follow-up.   ?  ?5.  Diabetes mellitus type 2 ?-Patient recently diagnosed with type 2 diabetes in February 2023, and noted to have fasting CBGs in the 30s to 40s per patient with some occasional highs. ?-Patient noted to be on Toujeo 24 units daily as well as glipizide 10 mg twice daily.  ?-Patient noted on admission to have blood glucose as low as 42 on presentation with seizure-like episodes and unresponsiveness.  ?-Patient placed on D5 normal saline on admission as  patient noted to be unresponsive with low blood glucose levels.  ?-Patient alert.  ?-Placed on a diet and discontinue D5 normal saline.  ?-Continue  CBGs every 4 hours.  ?-We will likely discontinue toujeo on discharge and likely decrease patient's glipizide to 5 mg daily.  ?-We will need close outpatient follow-up with PCP.  ?-Diabetes coordinator following.  ?-Continue current SSI ? ?6.  Hypertension ?-Patient alert today.   ?-Resume home regimen Aldactone.   ? ?7.  Coronary artery disease status post PCI to mid LAD 11/2019 ?-Stable. ?-Resume home regimen Aldactone. ? ?8.  COPD ?-Stable. ?-DuoNebs as needed. ? ?9.  Glaucoma ?-Outpatient follow-up. ? ?10.  Bipolar disorder ?-Psychiatry consulted saw the patient and no medication changes or recommendations at this time.   ?-Outpatient follow-up with psychiatry. ? ?11.  Hypothyroidism ?-Resume home regimen Synthroid ? ? ?DVT prophylaxis: Lovenox ?Code Status: DNR ?Family Communication: Updated patient and friend at bedside ?Disposition: Home once blood glucose levels have stabilized with no further hypoglycemic spells and off D5 normal saline hopefully in the next 24 to 48 hours. ? ?Status is: Inpatient ?Remains inpatient appropriate because: Severity of illness ?  ?Consultants:  ?Neurology: Dr. Leonel Ramsay 10/07/2021 ?Psychiatry: Dr. Lovette Cliche 10/08/2021 ? ?Procedures:  ?Continuous EEG 10/07/2021>>>> 10/08/2021 ?CT head 10/07/2021 ?Chest x-ray 10/07/2021 ? ? ?Antimicrobials:  ?None ? ? ?Subjective: ?Patient alert.  Sitting up in bed.  Denies any chest pain.  No shortness of breath.  Asking for food.  Asking when she is going to be able to go home.  Patient stated noted to have some low blood glucose levels at home and times when she has had prior syncopal episodes.  With a EEG on. ? ?Objective: ?Vitals:  ? 10/08/21 0432 10/08/21 0702 10/08/21 1100 10/08/21 1636  ?BP: (!) 122/53 108/70 (!) 129/57 (!) 124/52  ?Pulse: 77 74 76 79  ?Resp: 18 18 18 18   ?Temp: 97.8 ?F (36.6 ?C) 98 ?F (36.7 ?C) 98.3 ?F (36.8 ?C) 98 ?F (36.7 ?C)  ?TempSrc: Axillary Oral Oral Oral  ?SpO2: 94% 93% 96% 94%  ?Weight: 74.1 kg      ? ? ?Intake/Output Summary (Last 24 hours) at 10/08/2021 1656 ?Last data filed at 10/08/2021 0640 ?Gross per 24 hour  ?Intake 1431.84 ml  ?Output 400 ml  ?Net 1031.84 ml  ? ?Filed Weights  ? 10/08/21 0432  ?Weight: 74.1 kg  ? ? ?Examination: ? ?General exam: Alert.  NAD. ?Respiratory system: CTA B.  No wheezes, no crackles, rhonchi effort.  Speaking in full sentences. ?Cardiovascular system: Regular rate rhythm no murmurs rubs or gallops.  No JVD.  No lower extremity edema.  ?Gastrointestinal system: Abdomen is nondistended, soft and nontender. No organomegaly or masses felt. Normal bowel sounds heard. ?Central nervous system: Alert and oriented. No focal neurological deficits. ?Extremities: Symmetric 5 x 5 power. ?Skin: No rashes, lesions or ulcers ?Psychiatry: Judgement and insight appear normal. Mood & affect appropriate.  ? ? ? ?Data Reviewed: I have personally reviewed following labs and imaging studies ? ?CBC: ?Recent Labs  ?Lab 10/07/21 ?1134 10/07/21 ?1206 10/08/21 ?0407  ?WBC  --  6.3 5.4  ?NEUTROABS  --  3.9  --   ?HGB 13.6 12.1 11.3*  ?HCT 40.0 38.2 35.1*  ?MCV  --  88.8 86.7  ?PLT  --  134* 125*  ? ? ?Basic Metabolic Panel: ?Recent Labs  ?Lab 10/07/21 ?1134 10/07/21 ?1206 10/08/21 ?0407  ?NA 139 141 141  ?K 4.1 3.8 4.1  ?CL  --  107  113*  ?CO2  --  25 25  ?GLUCOSE  --  102* 93  ?BUN  --  16 14  ?CREATININE  --  1.15* 1.13*  ?CALCIUM  --  9.2 8.5*  ?MG  --   --  1.7  ? ? ?GFR: ?CrCl cannot be calculated (Unknown ideal weight.). ? ?Liver Function Tests: ?Recent Labs  ?Lab 10/07/21 ?1206 10/08/21 ?0407  ?AST 28 29  ?ALT 17 19  ?ALKPHOS 65 61  ?BILITOT 0.4 0.4  ?PROT 6.6 5.3*  ?ALBUMIN 3.4* 2.8*  ? ? ?CBG: ?Recent Labs  ?Lab 10/08/21 ?0010 10/08/21 ?[id number] 10/08/21 ?[id number] 10/08/21 ?1135 10/08/21 ?1632  ?GLUCAP 88 98 103* 93 148*  ? ? ? ?Recent Results (from the past 240 hour(s))  ?Resp Panel by RT-PCR (Flu A&B, Covid) Nasopharyngeal Swab     Status: None  ? Collection Time: 10/07/21 11:17 AM  ? Specimen:  Nasopharyngeal Swab; Nasopharyngeal(NP) swabs in vial transport medium  ?Result Value Ref Range Status  ? SARS Coronavirus 2 by RT PCR NEGATIVE NEGATIVE Final  ?  Comment: (NOTE) ?SARS-CoV-2 target nucleic acids are NOT DETECTED.

## 2021-10-08 NOTE — Consult Note (Addendum)
[facility] New Face-to-Face Psychiatric Evaluation ? ? ?Service Date: October 08, 2021 ?LOS:  LOS: 1 day  ? ? ?Assessment  ?Allison Hartman is a 76 y.o. female admitted medically for 10/07/2021 11:07 AM for syncope and episodes concerning for seizure-like activity. She carries the psychiatric diagnoses of bipolar disorder and conversion disorder, and has a past medical history of  COPD, HTN, nonalcoholic cirrhosis, and hypothyroidism.Psychiatry was consulted for presentation of seizure-like episodes by Dr. Janee Morn.  ? 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 She meets criteria for PNES based on normal EEG lab readings, isolated episodes occurring only in the presence of witnesses, rapid reorientation and return to baseline, and recent acute stressors. Another possible explanation is hypoglycemic episodes in pt who is a new diabetic on insulin; these are not mutually exclusive. Seizures from benzodiazepine w/d are less likely given normal EEG during episodes. Patient recently experienced the death of daughter a few months ago, and her husband a year prior. The accompanying depression and anxiety related to the recent events may also contribute to the onset of PNES episodes. Current outpatient psychotropic medications include Lorazepam 1mg  QID PRN. She has been compliant with medications prior to admission, reporting her last dose of Lorazepam was Wednesday (10/06/21) as evidenced by positive tox urine screen for benzodiazepines. On initial examination, patient is lying in bed appearing in no acute distress. Please see plan below for detailed recommendations.  ? ?Diagnoses:  ?Active Hospital problems: ?Principal Problem: ?  Syncope ?Active Problems: ?  Conversion disorder ?  Acute metabolic encephalopathy ?  Diabetes mellitus type 2, uncomplicated ([facility]) ?  Coronary artery disease: Status post PCI  to mid LAD 11/2019 ?  GERD (gastroesophageal reflux disease) ?  Cirrhosis, nonalcoholic ([facility]) ?  Essential hypertension ?  COPD (chronic obstructive pulmonary disease) ([facility]) ?  Bipolar disorder ([facility]) ?  Glaucoma ?  Hypothyroidism ?  History of pulmonary embolism ?  ? ? ?Plan  ?## Safety and Observation Level:  ?- Based on my clinical evaluation, I estimate the patient to be at low risk of self harm in the current setting ? ? ?## Medications:  ?-- No medication changes recommended at this time ?- If remaining inpt, restart home BZD at at least 50% of current dose; expectation is pt to discharge today.  ? ?-- Pt desires slow taper of BZD as outtpt which would generally be recommended ? ?## Behavioral Interventions:  ?-- outpatient CBT and mindfulness-based therapy recommend for PNES  ?-- encourage patient to attend grief support groups  ? ? ?## Medical Decision Making Capacity:  ?Not formally assessed, although today patient does appear to have medical decision making capacity ? ?## Further Work-up:  ?-- No further work-up required at this time.  ? ? ? ?-- most recent EKG on 10/07/2021 had QtC of 435 ?-- Pertinent labwork reviewed earlier this admission includes: A1C, TSH, UTS  ? ?## Disposition:  ?-- Discharge home ? ? ?Thank you for this consult request. Recommendations have been communicated to the primary team.  We will sign off at this time. If issues arise in the future, don't hesitate to reconsult the Psychiatry Inpatient Consult Service.  ? ? ? ?NEW history  ?Relevant Aspects of Hospital Course:  ?Admitted on 10/07/2021 for syncope and seizure-like activity. ? ?Patient Report:  ? ?Pt seen in AM. Patient demonstrated difficulty hearing during the encounter, with several questions needing to be repeated. Pt states  she was at her cardiologist's office when she passed out and woke up here in the hospital at 7:30. Pt was unaware of how she got here. She endorses a history of passing out at home after her legs give out  and states this has been going on for some time now. She has passed out with sugars in the 500s, which, incidentally is how she found out she was diabetic (was diagnosed 2-3 months ago). She does take insulin at home. Per the patient, she has a history of COPD, a stent in her heart, NASH cirrhosis, and a brest biopsy scheduled in the coming weeks. She sees a psychiatrist in [place] who helped her get through her husband's (in the past year) and daughter's (in the past few months) death - Dr. Rosetta PosnerMalcom Rosada. She does take lorazepam PRN for panic attacks. Pt reports her last dose was taken on Wednesday (4/26). Per PDMP taking 4x/day (once in the morning, and three doses at nighttime). This is the only psychotropic medication she reports taking. She is interested in tapering off as acute stressors resulting in original prescription have resolved of this but wants to talk through this with Dr. Denton Brickosada as an outpatient. Pt very aware of risks of stopping this med cold [place]. ? ?Pt reports her mood as "fabulous." Pt reports her sleep is good and she has been able to sleep 5-6 hours a night uninterrupted. Per bedside friend, pt wakes up early in the morning which is normal for her. Pt does report napping throughout the day as confirmed by bedside friend. Pt reports her hobbies include reading her books, listening to gospel music on the radio, and watching basketball. Pt reports still deriving satisfaction from her hobbies. Pt reports no sense of guilt or worthlessness. Pt reports decreased concentration and concerns about memory loss and possible onset of dementia. Pt does report increased irritability and becoming more easily agitated. Slight psychomotor agitation observed during encounter.  ? ?Pt denies any history of manic episodes, denying any periods of decreased sleep, elevated mood, or pressured speech. Pt does endorse a history of anxiety with accompanying panic attacks. Pt reports the panic attacks happen with  regularity and include diaphoresis, palpitations, and SOB.       ? ?Pt reports seeing "the most beautiful angel I have ever seen," a few days ago approach her, but denies the angel said anything to her. Pt has a belief that when she sees red cardinals it is a sign that her husband is watching over her. Pt reports being very religious. She states "I know where I'm going when I die."  Pt denies any paranoia or delusions.  ? ?Pt denies SI, HI, or plans for self harm.   ? ?Consult encounter was cut short after the patient became agitated with SI and HI questioning. Pt denied any SI or HI, claiming "that is crazy! I am not crazy! Tell the psychiatrist don't ask me those questions because I will send them right back out!"     ? ?ROS:  ?No ROS information collected. Encounter was cut short following episode of agitation by pt.   ? ?Collateral information:  ?Patient has friend at bedside  ? ?Psychiatric History:  ?Information collected from patient and bedside friend  ? ?Family psych history: No known family hx on file  ? ? ?Social History:  ? ?Tobacco use: no data recorded ?Alcohol use: no data recorded ?Drug use: no data recorded ? ?Family History:  ? ?The patient's family history is  not on file. ? ?Medical History: ?Past Medical History:  ?Diagnosis Date  ? Bipolar disorder ([facility]) 10/07/2021  ? Cirrhosis, nonalcoholic ([facility]) 10/07/2021  ? COPD (chronic obstructive pulmonary disease) ([facility]) 10/07/2021  ? Essential hypertension 10/07/2021  ? Glaucoma 10/07/2021  ? History of pulmonary embolism 10/07/2021  ? Hypothyroidism 10/07/2021  ? ? ?Surgical History: ?History reviewed. No pertinent surgical history. ? ?Medications:  ? ?Current Facility-Administered Medications:  ?  0.9 %  sodium chloride infusion, , Intravenous, Continuous, Rodolph Bong, MD ?  acetaminophen (TYLENOL) tablet 650 mg, 650 mg, Oral, Q6H PRN **OR** acetaminophen (TYLENOL) suppository 650 mg, 650 mg, Rectal, Q6H PRN, Rodolph Bong, MD ?  albuterol  (PROVENTIL) (2.5 MG/3ML) 0.083% nebulizer solution 2.5 mg, 2.5 mg, Nebulization, Q2H PRN, Rodolph Bong, MD ?  bisacodyl (DULCOLAX) suppository 10 mg, 10 mg, Rectal, Daily PRN, Rodolph Bong, MD ?

## 2021-10-08 NOTE — Progress Notes (Signed)
Inpatient Diabetes Program Recommendations ? ?AACE/ADA: New Consensus Statement on Inpatient Glycemic Control (2015) ? ?Target Ranges:  Prepandial:   less than 140 mg/dL ?     Peak postprandial:   less than 180 mg/dL (1-2 hours) ?     Critically ill patients:  140 - 180 mg/dL  ? ?Lab Results  ?Component Value Date  ? GLUCAP 103 (H) 10/08/2021  ? HGBA1C 6.7 (H) 10/08/2021  ? ? ?Review of Glycemic Control ? Latest Reference Range & Units 10/07/21 11:36 10/07/21 15:48 10/07/21 16:16 10/07/21 21:30 10/08/21 00:10 10/08/21 04:38 10/08/21 07:28  ?Glucose-Capillary 70 - 99 mg/dL 169 (H) 42 (LL) 678 (H) 78 88 98 103 (H)  ?(LL): Data is critically low ?(H): Data is abnormally high ? ?Diabetes history: DM2 ?Outpatient Diabetes medications: Toujeo 24 units qd, Glipizide 10 mg bid ?Current orders for Inpatient glycemic control: Novolog 0-9 units q 4 hrs. ? ?Inpatient Diabetes Program Recommendations:   ?Spoke with patient regarding her diabetes. ?Patient states her fasting CBGs have been running 30-40. States she has reported her low CBGs to PCP office but has not adjusted her insulin and Glucotrol. Patient was just diagnosed with diabetes in February and with nutrition changes has lost approximately 26 lbs since February. Patient attended an outpatient nutrition class but states she did not feel that she gained a lot of knowledge about nutrition. Reviewed basic plate method with patient and will attach information on hypoglycemia and nutrition in exit notes. ?Patient appreciated information and has decreased sugar in foods and drinks along with limiting carbohydrates in foods. Patient states she has an appointment with an endocrinologist with [facility] coming up in July to assist with her diabetes and thyroid management. ? ?Ordered Living Well With Diabetes booklet. ? ?Please consider on discharge: ?-Libre sensor for discharge home to review CBGs easily and more often ( order # [id number]) ?-D/C Glucotrol or decrease to 5 mg  qd ?-Decrease Toujeo to 10 units or hold until next PCP appointment ?Secure  chat sent to Dr. Janee Morn ? ?Thank you, ?Billy Fischer Reality Dejonge, RN, MSN, CDE  ?Diabetes Coordinator ?Inpatient Glycemic Control Team ?Team Pager [phone] (8am-5pm) ?10/08/2021 11:49 AM ? ? ? ? ? ?

## 2021-10-08 NOTE — Progress Notes (Signed)
LTM EEG discontinued - no skin breakdown at unhook.   

## 2021-10-08 NOTE — Procedures (Addendum)
Patient Name: Allison Hartman  ?MRN: [id number]  ?Epilepsy Attending: Charlsie Quest  ?Referring Physician/Provider: Rejeana Brock, MD ?Duration: 10/07/2021 1434 to 10/08/2021 1133 ?  ?Patient history: 89 old female with an episode of syncope and loss of awareness.  EEG to evaluate for seizure. ?  ?Level of alertness: Awake, asleep ?  ?AEDs during EEG study: None ?  ?Technical aspects: This EEG study was done with scalp electrodes positioned according to the 10-20 International system of electrode placement. Electrical activity was acquired at a sampling rate of 500Hz  and reviewed with a high frequency filter of 70Hz  and a low frequency filter of 1Hz . EEG data were recorded continuously and digitally stored.  ?  ?Description: The posterior dominant rhythm consists of 8Hz  activity of moderate voltage (25-35 uV) seen predominantly in posterior head regions, symmetric and reactive to eye opening and eye closing.  ?  ?Multiple events were recorded on 10/07/2021 between 1434 to 1530. Patient was laying in bed had bilateral upper extremity/shoulder jerking, eyes were closed. Event lasted for about 20 seconds.  Concomitant EEG before, during and after  did not show any EEG changes suggest seizure. ?  ?Hyperventilation and photic stimulation were not performed.    ?  ?IMPRESSION: ?This study is within normal limits. No seizures or epileptiform discharges were seen throughout the recording. ?  ?Multiple events were recorded on 10/07/2021 between 1434 to 1530 as described above without concomitant EEG change.  These events were non-epileptic. ?  ?  ?[name]  ?

## 2021-10-09 DIAGNOSIS — J449 Chronic obstructive pulmonary disease, unspecified: Secondary | ICD-10-CM | POA: Diagnosis not present

## 2021-10-09 DIAGNOSIS — G9341 Metabolic encephalopathy: Secondary | ICD-10-CM | POA: Diagnosis not present

## 2021-10-09 DIAGNOSIS — F319 Bipolar disorder, unspecified: Secondary | ICD-10-CM | POA: Diagnosis not present

## 2021-10-09 DIAGNOSIS — R55 Syncope and collapse: Secondary | ICD-10-CM | POA: Diagnosis not present

## 2021-10-09 LAB — BASIC METABOLIC PANEL
Anion gap: 6 (ref 5–15)
BUN: 13 mg/dL (ref 8–23)
CO2: 25 mmol/L (ref 22–32)
Calcium: 8.8 mg/dL — ABNORMAL LOW (ref 8.9–10.3)
Chloride: 107 mmol/L (ref 98–111)
Creatinine, Ser: 1.11 mg/dL — ABNORMAL HIGH (ref 0.44–1.00)
GFR, Estimated: 52 mL/min — ABNORMAL LOW (ref >60)
Glucose, Bld: 124 mg/dL — ABNORMAL HIGH (ref 70–99)
Potassium: 3.6 mmol/L (ref 3.5–5.1)
Sodium: 138 mmol/L (ref 135–145)

## 2021-10-09 LAB — CBC WITH DIFFERENTIAL/PLATELET
Abs Immature Granulocytes: 0.01 10*3/uL (ref 0.00–0.07)
Basophils Absolute: 0 10*3/uL (ref 0.0–0.1)
Basophils Relative: 0 %
Eosinophils Absolute: 0.2 10*3/uL (ref 0.0–0.5)
Eosinophils Relative: 5 %
HCT: 35.2 % — ABNORMAL LOW (ref 36.0–46.0)
Hemoglobin: 11.4 g/dL — ABNORMAL LOW (ref 12.0–15.0)
Immature Granulocytes: 0 %
Lymphocytes Relative: 35 %
Lymphs Abs: 1.9 10*3/uL (ref 0.7–4.0)
MCH: 28.1 pg (ref 26.0–34.0)
MCHC: 32.4 g/dL (ref 30.0–36.0)
MCV: 86.7 fL (ref 80.0–100.0)
Monocytes Absolute: 0.3 10*3/uL (ref 0.1–1.0)
Monocytes Relative: 6 %
Neutro Abs: 2.8 10*3/uL (ref 1.7–7.7)
Neutrophils Relative %: 54 %
Platelets: 126 10*3/uL — ABNORMAL LOW (ref 150–400)
RBC: 4.06 MIL/uL (ref 3.87–5.11)
RDW: 15 % (ref 11.5–15.5)
WBC: 5.2 10*3/uL (ref 4.0–10.5)
nRBC: 0 % (ref 0.0–0.2)

## 2021-10-09 LAB — GLUCOSE, CAPILLARY
Glucose-Capillary: 113 mg/dL — ABNORMAL HIGH (ref 70–99)
Glucose-Capillary: 128 mg/dL — ABNORMAL HIGH (ref 70–99)
Glucose-Capillary: 144 mg/dL — ABNORMAL HIGH (ref 70–99)

## 2021-10-09 LAB — MAGNESIUM: Magnesium: 1.5 mg/dL — ABNORMAL LOW (ref 1.7–2.4)

## 2021-10-09 MED ORDER — GLIPIZIDE 5 MG PO TABS: 5.0000 mg | ORAL_TABLET | Freq: Every day | ORAL | 1 refills | Status: AC

## 2021-10-09 MED ORDER — POTASSIUM CHLORIDE CRYS ER 10 MEQ PO TBCR
40.0000 meq | EXTENDED_RELEASE_TABLET | Freq: Once | ORAL | Status: AC
  Administered 2021-10-09: 40 meq via ORAL
  Filled 2021-10-09: qty 4

## 2021-10-09 MED ORDER — MAGNESIUM SULFATE 4 GM/100ML IV SOLN
4.0000 g | Freq: Once | INTRAVENOUS | Status: AC
  Administered 2021-10-09: 4 g via INTRAVENOUS
  Filled 2021-10-09: qty 100

## 2021-10-09 MED ORDER — TIZANIDINE HCL 4 MG PO TABS: 2.0000 mg | ORAL_TABLET | Freq: Every day | ORAL | 0 refills | Status: AC

## 2021-10-09 MED ORDER — LORAZEPAM 1 MG PO TABS: ORAL_TABLET | ORAL | 0 refills | Status: AC

## 2021-10-09 NOTE — Progress Notes (Signed)
PT Cancellation Note ? ?Patient Details ?Name: Allison Hartman ?MRN: [id number] ?DOB: 07/16/1945 ? ? ?Cancelled Treatment:    Reason Eval/Treat Not Completed: PT screened, no needs identified, will sign off (pt seen by OT and reports independence without need for P.T. as well as D/C order. will screen) ? ? ?Juliona Vales B Ovida Delagarza ?10/09/2021, 11:01 AM ?Hayle Parisi P, PT ?Acute Rehabilitation Services ?Pager: [phone] ?Office: [phone] ? ?

## 2021-10-09 NOTE — Plan of Care (Signed)
?  Problem: Education: ?Goal: Knowledge of General Education information will improve ?Description: Including pain rating scale, medication(s)/side effects and non-pharmacologic comfort measures ?Outcome: Progressing ?  ?Problem: Health Behavior/Discharge Planning: ?Goal: Ability to manage health-related needs will improve ?Outcome: Progressing ?  ?Problem: Clinical Measurements: ?Goal: Ability to maintain clinical measurements within normal limits will improve ?Outcome: Progressing ?Goal: Will remain free from infection ?Outcome: Progressing ?Goal: Diagnostic test results will improve ?Outcome: Progressing ?Goal: Respiratory complications will improve ?Outcome: Not Applicable ?Goal: Cardiovascular complication will be avoided ?Outcome: Progressing ?  ?Problem: Activity: ?Goal: Risk for activity intolerance will decrease ?Outcome: Progressing ?  ?Problem: Nutrition: ?Goal: Adequate nutrition will be maintained ?Outcome: Progressing ?  ?Problem: Elimination: ?Goal: Will not experience complications related to bowel motility ?Outcome: Progressing ?Goal: Will not experience complications related to urinary retention ?Outcome: Progressing ?  ?Problem: Pain Managment: ?Goal: General experience of comfort will improve ?Outcome: Progressing ?  ?Problem: Safety: ?Goal: Ability to remain free from injury will improve ?Outcome: Progressing ?  ?Problem: Skin Integrity: ?Goal: Risk for impaired skin integrity will decrease ?Outcome: Progressing ?  ?

## 2021-10-09 NOTE — Evaluation (Signed)
Occupational Therapy Evaluation ?Patient Details ?Name: Allison Hartman ?MRN: [id number] ?DOB: 06/03/1946 ?Today's Date: 10/09/2021 ? ? ?History of Present Illness 76 yo female admitted 4/27 after syncope with seizure like activity with negative EEG and head CT. Pt diagnosed with psychogenic nonepileptic seizures (PNES). PMhx: HTn, CAD, CVA, DM, bipolar, hypothyroidism, glaucoma, conversion disorder, GERD  ? ?Clinical Impression ?  ?Pt PTA: Pt lives in ILF with a family friend who can assist 24/7. Pt reports independence with ADL and varies with AD- usually rollator. Pt currently, with no LOB with standing tasks. Pt performed own toilet hygiene and stood at sink for x3 mins for light ADL. Pt able to reach feet for LB ADL tasks and has assist as home as needed.  O2 >96%O2 with exertion on RA; HR 87 BPM. No seizure like behavior noted. Pt does not require continued OT skilled services at this time. OT signing off. Thank you. ?   ? ?Recommendations for follow up therapy are one component of a multi-disciplinary discharge planning process, led by the attending physician.  Recommendations may be updated based on patient status, additional functional criteria and insurance authorization.  ? ?Follow Up Recommendations ? No OT follow up  ?  ?Assistance Recommended at Discharge PRN  ?Patient can return home with the following Assist for transportation ? ?  ?Functional Status Assessment ? Patient has had a recent decline in their functional status and demonstrates the ability to make significant improvements in function in a reasonable and predictable amount of time.  ?Equipment Recommendations ? None recommended by OT  ?  ?Recommendations for Other Services   ? ? ?  ?Precautions / Restrictions Precautions ?Precautions: None ?Restrictions ?Weight Bearing Restrictions: No  ? ?  ? ?Mobility Bed Mobility ?Overal bed mobility: Independent ?  ?  ?  ?  ?  ?  ?  ?  ? ?Transfers ?Overall transfer level: Independent ?  ?  ?  ?  ?  ?   ?  ?  ?  ?  ? ?  ?Balance Overall balance assessment: Modified Independent, No apparent balance deficits (not formally assessed) ?  ?  ?  ?  ?  ?  ?  ?  ?  ?  ?  ?  ?  ?  ?  ?  ?  ?  ?   ? ?ADL either performed or assessed with clinical judgement  ? ?ADL Overall ADL's : Modified independent;At baseline ?  ?  ?  ?  ?  ?  ?  ?  ?  ?  ?  ?  ?  ?  ?  ?  ?  ?  ?  ?General ADL Comments: No LOB with standing tasks; pt performed own toilet hygiene and stood at sink for x3 mins for light ADL. Pt able to reach feet for LB ADL tasks and has assist as home as needed.  ? ? ? ?Vision Baseline Vision/History: 1 Wears glasses ?Ability to See in Adequate Light: 0 Adequate ?Patient Visual Report: No change from baseline ?Vision Assessment?: No apparent visual deficits  ?   ?Perception   ?  ?Praxis   ?  ? ?Pertinent Vitals/Pain Pain Assessment ?Pain Assessment: No/denies pain  ? ? ? ?Hand Dominance Right ?  ?Extremity/Trunk Assessment Upper Extremity Assessment ?Upper Extremity Assessment: Overall WFL for tasks assessed ?  ?Lower Extremity Assessment ?Lower Extremity Assessment: Overall WFL for tasks assessed ?  ?Cervical / Trunk Assessment ?Cervical / Trunk Assessment: Normal ?  ?Communication  Communication ?Communication: HOH ?  ?Cognition Arousal/Alertness: Awake/alert ?Behavior During Therapy: [facility] for tasks assessed/performed ?Overall Cognitive Status: Within Functional Limits for tasks assessed ?  ?  ?  ?  ?  ?  ?  ?  ?  ?  ?  ?  ?  ?  ?  ?  ?General Comments: A/O x4; aware of situation and disposition ?  ?  ?General Comments  O2 >96%O2 with exertion on RA; HR 87 BPM ? ?  ?Exercises   ?  ?Shoulder Instructions    ? ? ?Home Living Family/patient expects to be discharged to:: Private residence ?Living Arrangements: Other relatives Myrtie Hawk (family friend)) ?Available Help at Discharge: Friend(s);Available 24 hours/day ?Type of Home: Independent living facility ?  ?  ?  ?Home Layout: One level ?  ?  ?Bathroom Shower/Tub: Walk-in  shower ?  ?Bathroom Toilet: Handicapped height ?Bathroom Accessibility: Yes ?How Accessible: Accessible via walker;Accessible via wheelchair ?Home Equipment: Rollator (4 wheels);Cane - single point;Shower seat;Grab bars - toilet;Grab bars - tub/shower;Hand held shower head;Electric scooter ?  ?  ?  ? ?  ?Prior Functioning/Environment Prior Level of Function : Independent/Modified Independent ?  ?  ?  ?  ?  ?  ?Mobility Comments: used rollator for mobility daily; scooter for "walking" her dog; has w/c, but does not use. ?ADLs Comments: reports independence with ADL; [place] provides transportation ?  ? ?  ?  ?OT Problem List: Decreased activity tolerance ?  ?   ?OT Treatment/Interventions:    ?  ?OT Goals(Current goals can be found in the care plan section) Acute Rehab OT Goals ?Patient Stated Goal: to go home ?OT Goal Formulation: All assessment and education complete, DC therapy ?Potential to Achieve Goals: Good  ?OT Frequency:   ?  ? ?Co-evaluation   ?  ?  ?  ?  ? ?  ?AM-PAC OT "6 Clicks" Daily Activity     ?Outcome Measure Help from another person eating meals?: None ?Help from another person taking care of personal grooming?: None ?Help from another person toileting, which includes using toliet, bedpan, or urinal?: None ?Help from another person bathing (including washing, rinsing, drying)?: A Little ?Help from another person to put on and taking off regular upper body clothing?: None ?Help from another person to put on and taking off regular lower body clothing?: None ?6 Click Score: 23 ?  ?End of Session Equipment Utilized During Treatment: Gait belt ?Nurse Communication: Mobility status ? ?Activity Tolerance: Patient tolerated treatment well ?Patient left: in bed;with call bell/phone within reach;with family/visitor present ? ?OT Visit Diagnosis: Unsteadiness on feet (R26.81)  ?              ?Time: 5573-2202 ?OT Time Calculation (min): 16 min ?Charges:  OT General Charges ?$OT Visit: 1 Visit ?OT  Evaluation ?$OT Eval Moderate Complexity: 1 Mod ? ?Flora Lipps, OTR/L ?Acute Rehabilitation Services ?Pager: [phone] ?Office: [phone] ? ? ?[facility]  C ?10/09/2021, 12:10 PM ?

## 2021-10-09 NOTE — Discharge Summary (Addendum)
Physician Discharge Summary  ?Allison OuchBarbara Alice Halter ZOX:[id number] DOB: May 13, 1946 DOA: 10/07/2021 ? ?PCP: Willaim ShengShellhorn, Douglas, MD ? ?Admit date: 10/07/2021 ?Discharge date: 10/09/2021 ? ?Time spent: 60 minutes ? ?Recommendations for Outpatient Follow-up:  ?Follow-up with Willaim ShengShellhorn, Douglas, MD in 1 week.  Patient's long-acting insulin has been discontinued and glipizide decreased to half his home dose due to concerns for multiple hypoglycemic spells.  Diabetes will need to be followed up upon.  Patient will need a basic metabolic profile, magnesium level checked to follow-up on electrolytes and renal function.  Patient will need hypothyroidism followed up upon. ?Follow-up with primary psychiatrist in 1-2 weeks or previously scheduled ? ? ?Discharge Diagnoses:  ?Principal Problem: ?  Syncope ?Active Problems: ?  Conversion disorder ?  Acute metabolic encephalopathy ?  Diabetes mellitus type 2, uncomplicated (HCC) ?  Coronary artery disease: Status post PCI to mid LAD 11/2019 ?  GERD (gastroesophageal reflux disease) ?  Cirrhosis, nonalcoholic (HCC) ?  Essential hypertension ?  COPD (chronic obstructive pulmonary disease) (HCC) ?  Bipolar disorder (HCC) ?  Glaucoma ?  Hypothyroidism ?  History of pulmonary embolism ?  History of psychogenic nonepileptic seizure ?  Seizure-like activity (HCC) ?  Hypomagnesemia ? ? ?Discharge Condition: Stable and improved ? ?Diet recommendation: Heart healthy ? ?Filed Weights  ? 10/08/21 0432 10/09/21 0440  ?Weight: 74.1 kg 71.2 kg  ? ? ?History of present illness:  ?Allison Hartman is a 76 y.o. female with medical history significant of hypertension, coronary artery disease status post PCI to mid LAD, history of TIA/CVA, nonalcoholic cirrhosis, diabetes type 2, bipolar disorder, hypothyroidism, GERD who presents to the ED from cardiologist office with a syncopal episode via EMS.  Patient unresponsive and as such history obtained per ED physician.  Per EDP, it is noted that patient has had  this episodes before.  At cardiologist office glucometer unavailable however patient given some glucose.  Orthostatics not checked.  Patient brought to the ED where patient noted to have seizure-like episodes of upper extremity tremors.  Neurology consulted.  Hospitalist consulted ?  ?ED Course: Patient seen in the ED, noted to be unresponsive.  Patient noted to have some seizure-like activity with upper extremity tremors during evaluation in the ED received IV Keppra load.  Head CT done negative for any acute abnormalities.  Chest x-ray negative for any acute abnormalities.  Venous blood gas unremarkable.  Comprehensive metabolic profile with a glucose of 102, creatinine of 1.15, albumin of 3.4 otherwise was within normal limits.  High-sensitivity troponin negative at 5.  CBC unremarkable.  Influenza A/B PCR negative.  SARS coronavirus PCR negative.  Alcohol level < 10.  Patient received IV Keppra load neurology consulted who assessed patient.  Hospitalist called to admit patient for further evaluation and management. ?  ? ?Hospital Course:  ?#1 syncope/seizure-like activity/probable conversion disorder/pseudoseizures/psychogenic nonepileptic spells in the setting of hypoglycemic spells. ?-Patient presented with syncopal episode from cardiologist office. ?-CBG not obtained however patient did receive some glucose. ?-Likely multifactorial in the setting of probable hypoglycemic spells, psychogenic nonepileptic spells, concern for hypoglycemic spells.   ?-Patient also noted to have experienced recent death of her daughter a few months ago as well as her husband a year prior.  Also concern for secondary gain. ?-Patient on presentation unresponsive to both noxious and verbal stimuli however protecting airway. ?-Unable to obtain orthostatics due to patient's unresponsiveness. ?-No arrhythmias noted on telemetry or EKG. ?-Head CT done unremarkable. ?-Chest x-ray with no acute abnormalities. ?-Patient afebrile, no signs  or symptoms of infection. ?-VBG unremarkable. ?-Patient was loaded with IV Keppra on presentation. ?-Patient underwent EEG on presentation as well as overnight EEG which did not show any seizures.   ?-Patient seen and followed by neurology during the hospitalization who explained to patient about psychogenic nonepileptic spells/neuro functional seizures.   ?-Patient alert, following commands on hospital day 2..   ?-Patient noted to have CBGs in the 40s on presentation and initially placed on D5 normal saline.   ?-Normal saline discontinued and patient started on a carb modified diet. ?-Patient was followed by neurology who have signed off.   ?-Patient also seen in consultation by psychiatry.   ?Resumed half home dose Ativan.   ?-Per psychiatry patient on Ativan 1 mg every morning and 3 mg nightly. ?-Outpatient follow-up with primary psychiatrist for Ativan taper. ? ?2.  GERD ?-Patient maintained on PPI.   ? ?3.  Acute metabolic encephalopathy ?-Likely secondary to problem #1. ?-Patient with no signs or symptoms of infection. ?-See #1. ?-Mentation had resolved and was back to baseline by day of discharge. ? ?4.  Hyperlipidemia ?-Outpatient follow-up.   ?  ?5.  Diabetes mellitus type 2 ?-Patient recently diagnosed with type 2 diabetes in February 2023, and noted to have fasting CBGs in the 30s to 40s per patient with some occasional highs. ?-Patient noted to be on Toujeo 24 units daily as well as glipizide 10 mg twice daily.  ?-Patient noted on admission to have blood glucose as low as 42 on presentation with seizure-like episodes and unresponsiveness.  ?-Patient placed on D5 normal saline on admission as patient noted to be unresponsive with low blood glucose levels.  ?-Patient improved clinically. ?-Placed on a diet and discontinued D5 normal saline and patient maintained on SSI for.  ?-CBGs checked every 4 hours and blood glucose levels range from 93-148. ?-Patient is toujeo will be discontinued on discharge and  home dose glipizide will be decreased to 5 mg daily on discharge. ?-Outpatient follow-up with PCP 1 week postdischarge. ?-Patient seen by diabetic coordinator during the hospitalization. ?-Outpatient follow-up. ? ?6.  Hypertension ?-Once patient's mental status improved patient resumed back on home regimen Aldactone. ? ?7.  Coronary artery disease status post PCI to mid LAD 11/2019 ?-Stable. ?-Patient resumed back on home regimen Aldactone.   ?-Outpatient follow-up with primary cardiologist ? ?8.  COPD ?-Stable. ?-Patient was maintained on DuoNebs as needed. ? ?9.  Glaucoma ?-Outpatient follow-up. ? ?10.  Bipolar disorder ?-Psychiatry consulted saw the patient and no medication changes or recommendations at this time.   ?-Outpatient follow-up with primary psychiatry. ? ?11.  Hypothyroidism ?-TSH noted at 9.189.  Free T4 at 0.93.   ?-Patient maintained on home regimen Synthroid. ?-Outpatient follow-up with PCP. ? ?12.  Hypomagnesemia ?-Magnesium noted at 1.5. ?-Patient received magnesium sulfate 4 g IV x1 on day of discharge. ?-Outpatient follow-up. ?  ? ?Procedures: ?Continuous EEG 10/07/2021>>>> 10/08/2021 ?CT head 10/07/2021 ?Chest x-ray 10/07/2021 ?  ? ?Consultations: ?Neurology: Dr. Amada Jupiter 10/07/2021 ?Psychiatry: Dr. Gasper Sells 10/08/2021 ? ?Discharge Exam: ?Vitals:  ? 10/09/21 0004 10/09/21 0440  ?BP: (!) 147/62 137/66  ?Pulse: 80 76  ?Resp: 18 18  ?Temp: 98 ?F (36.7 ?C) 98.3 ?F (36.8 ?C)  ?SpO2:  94%  ? ? ?General: NAD.  Alert. ?Cardiovascular: Regular rate rhythm no murmurs rubs or gallops.  No JVD.  No lower extremity edema. ?Respiratory: Clear to auscultation bilaterally.  No wheezes, no crackles, no rhonchi. ? ?Discharge Instructions ? ? ?Discharge Instructions   ? ?  Diet Carb Modified   Complete by: As directed ?  ? Increase activity slowly   Complete by: As directed ?  ? ?  ? ?Allergies as of 10/09/2021   ? ?   Reactions  ? Bee Venom Anaphylaxis  ? Clam Shell Anaphylaxis  ? Fentanyl Itching  ?   ? Shrimp  (diagnostic) Anaphylaxis  ? Erythromycin Hives  ? Morphine And Related Itching  ? Itchy feet (pt stated she can still tolerate) ?Itchy feet - High/prolonged dose   ? Other Nausea And Vomiting  ? STEROIDS

## 2021-10-09 NOTE — TOC CM/SW Note (Signed)
Spoke with Mrs. Reels  prior to [employer]. Denies having any TOC needs. States she has ride home today.  ? ?Raiford Noble, MSN, RN,BSN ?[facility] Case Manager ?[phone]   ?

## 2022-08-06 IMAGING — DX DG CHEST 1V PORT
1 series · 1 of 1 positions shown · non-contrast
Comparison: None.

CLINICAL DATA: Altered mental status.

EXAM:
PORTABLE CHEST 1 VIEW

[chest]
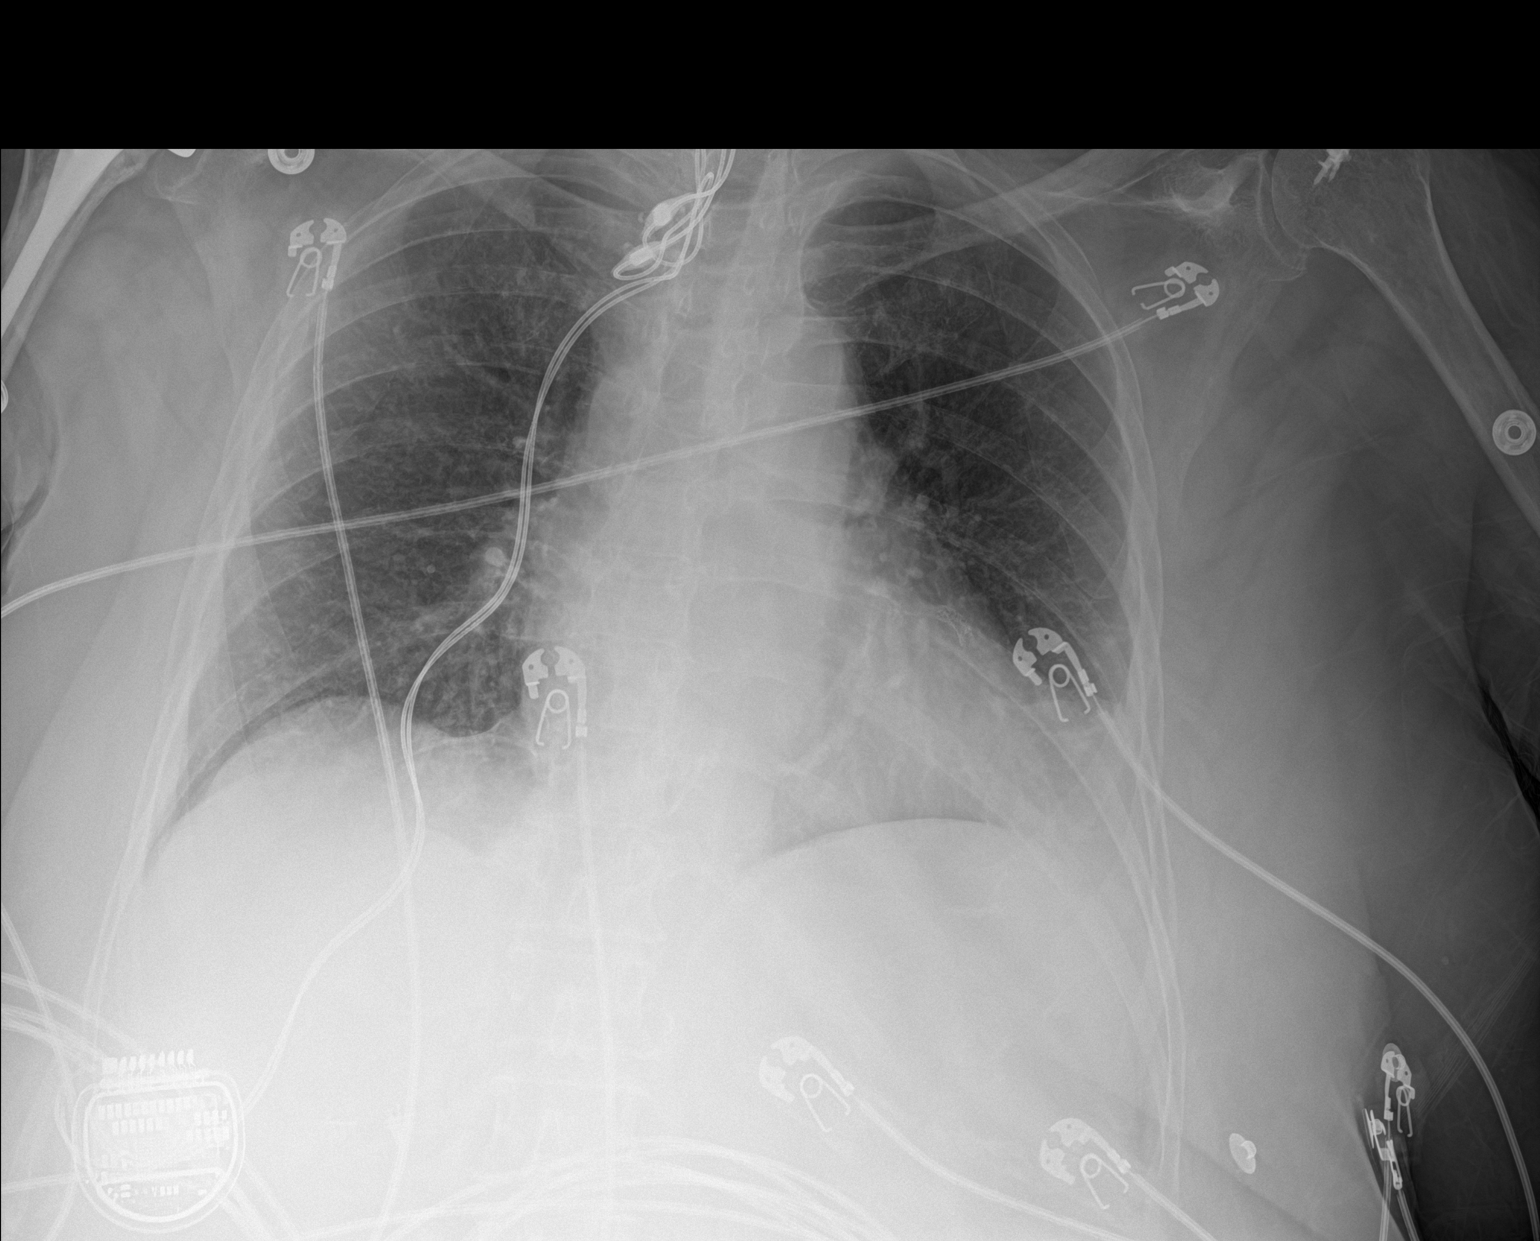

[1 of 1 positions shown; findings below may reference images not displayed]

FINDINGS: Cardiac silhouette and mediastinal contours are within normal
limits. Mild calcification within aortic arch. Mildly decreased lung
volumes. Left cardiophrenic angle density may represent normal fat
pad. Otherwise, the lungs are clear. No pleural effusion or
pneumothorax. Minimal dextrocurvature of the midthoracic spine with
mild multilevel degenerative disc changes. Status post right
shoulder hemiarthroplasty. Two screw anchors overlie the left
humeral head. Moderate superior left glenohumeral joint space
narrowing.
IMPRESSION: Mildly decreased lung volumes with left costophrenic angle density
favored to represent a normal epicardial fat pad on limited single
frontal view. No definite acute lung process.

## 2022-08-06 IMAGING — CT CT HEAD W/O CM
4 of 5 series · 15 of 47 positions shown, 17 images · non-contrast
Comparison: None

CLINICAL DATA: Mental status changes, loss of consciousness,
syncopal episode



[Series 3: head wo · axial · 0.42mm/px · z∈[-133,-33]mm · 5 of 31 slices shown, 7 images]
[im 6/31  brain]
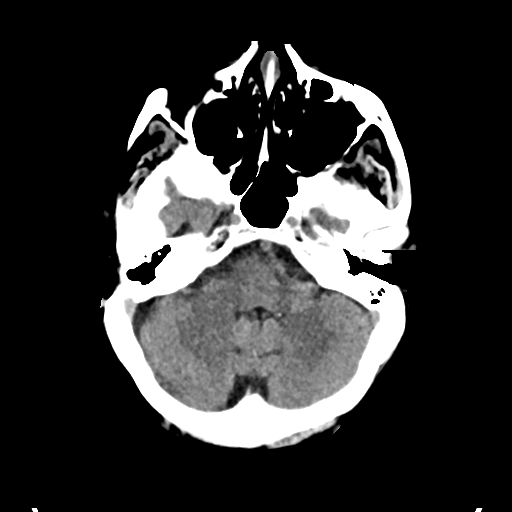
[im 6/31  bone]
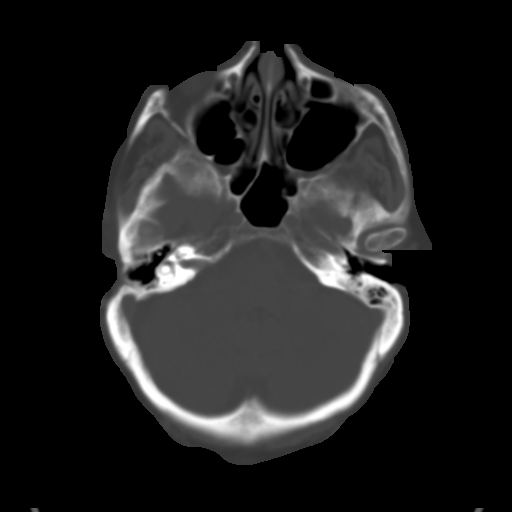
[im 11/31  brain]
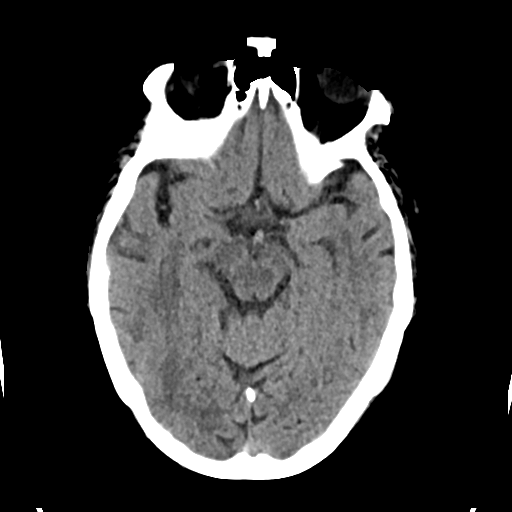
[im 16/31  brain]
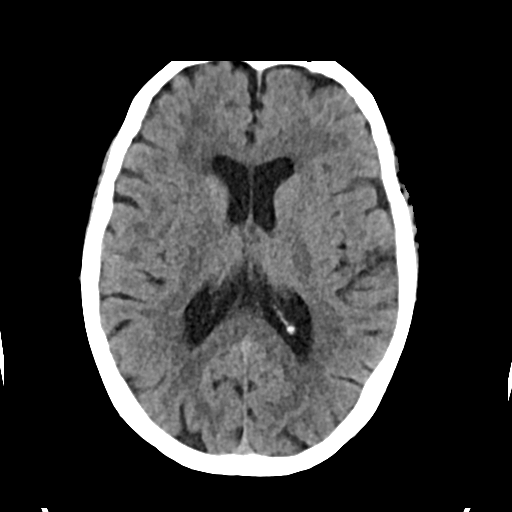
[im 21/31  brain]
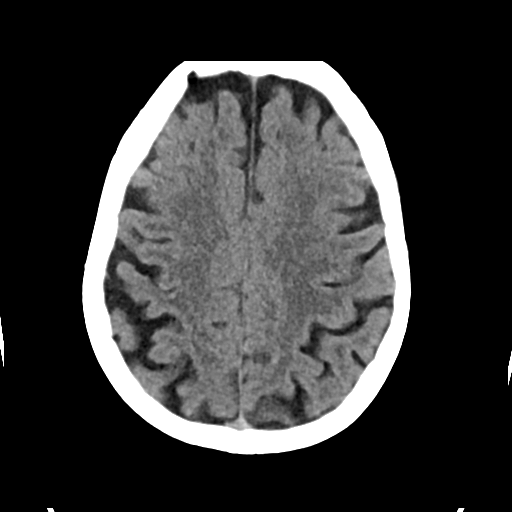
[im 26/31  brain]
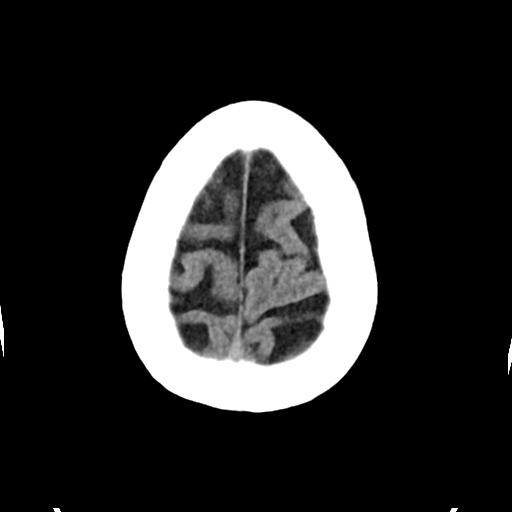
[im 26/31  bone]
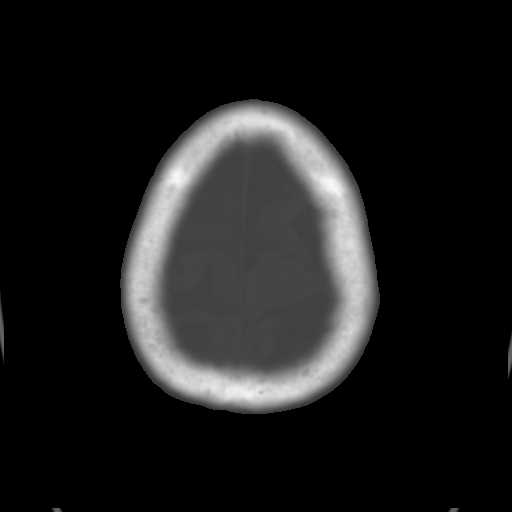

[Series 5: cor soft · coronal · 0.32mm/px · 3 of 71 slices shown]
[im 24/71  brain]
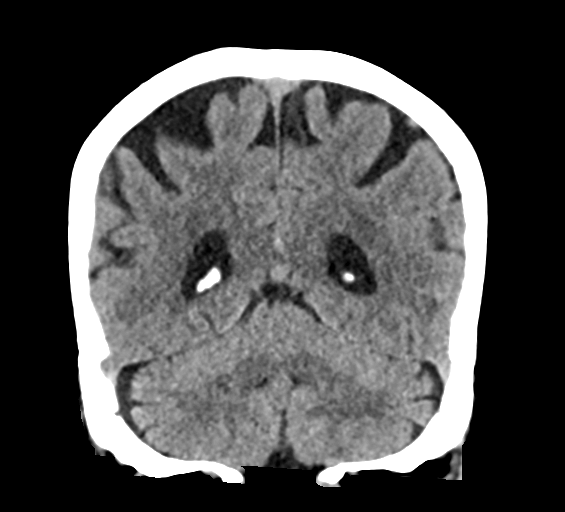
[im 32/71  brain]
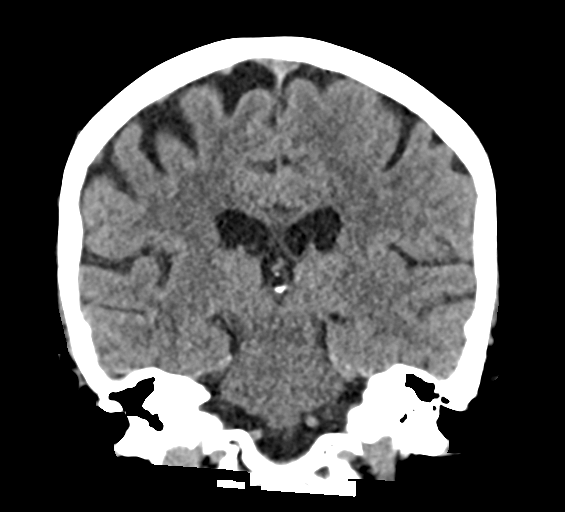
[im 39/71  brain]
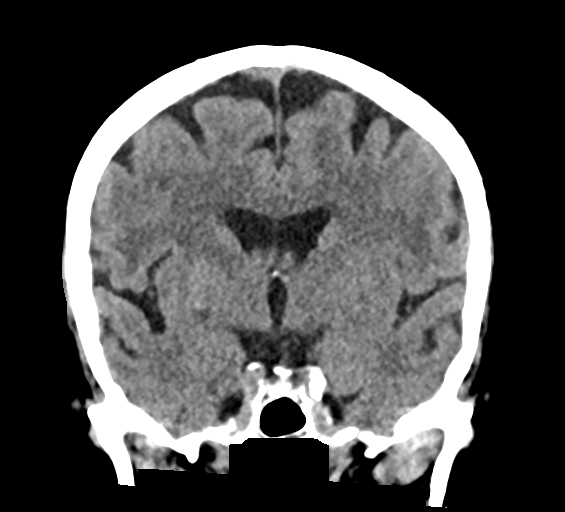

[Series 6: sag soft · sagittal · 0.33mm/px · 3 of 58 slices shown]
[im 20/58  brain]
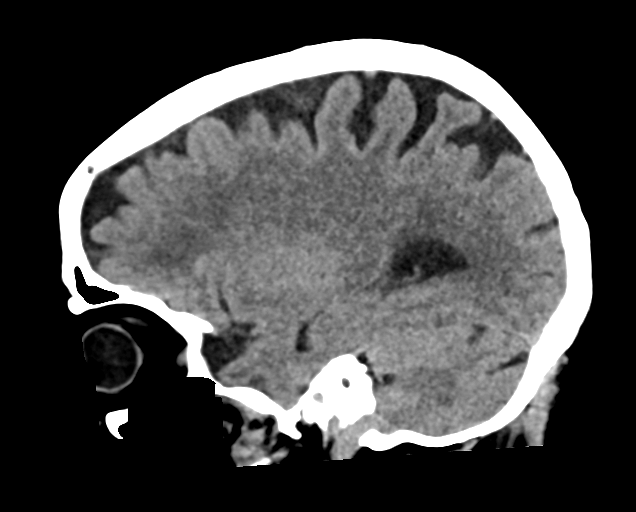
[im 29/58  brain]
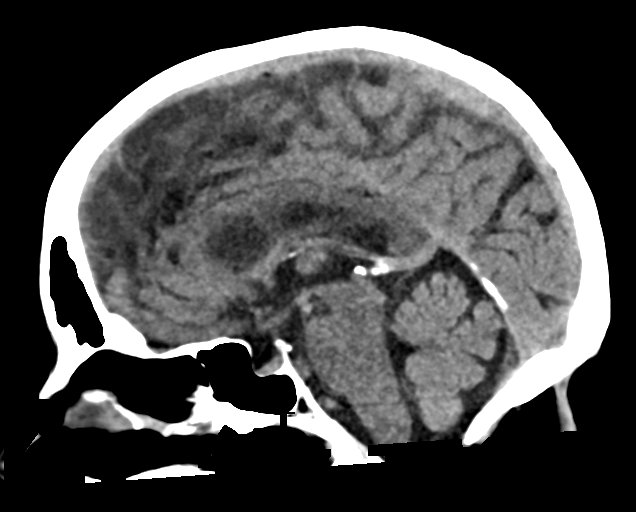
[im 39/58  brain]
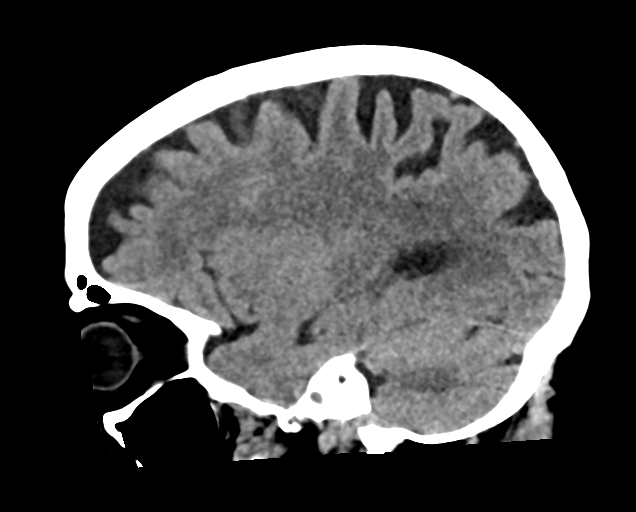

[Series 7: true axial · axial · 0.34mm/px · z∈[-134,-49]mm · 4 of 34 slices shown]
[im 6/34  brain]
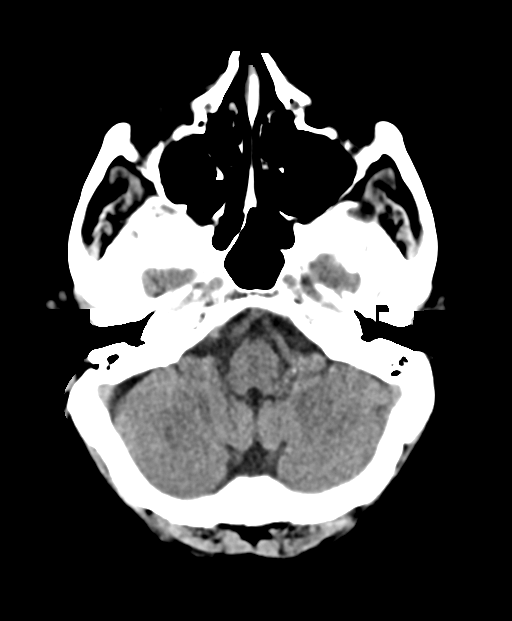
[im 12/34  brain]
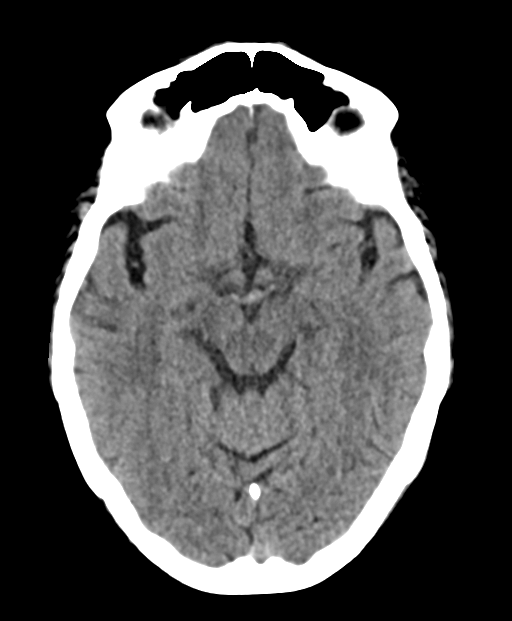
[im 17/34  brain]
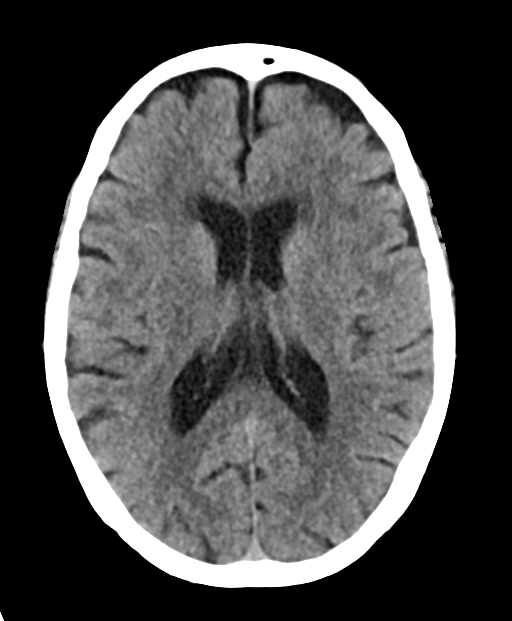
[im 23/34  brain]
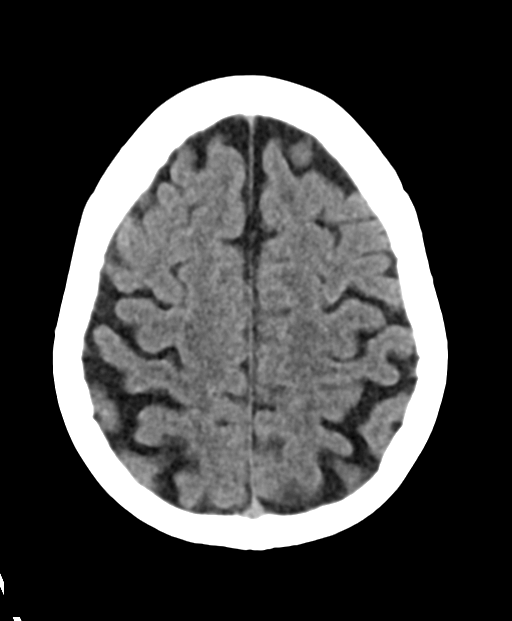

[15 of 47 positions shown; findings below may reference images not displayed]

FINDINGS: Brain: Generalized atrophy. Normal ventricular morphology. No
midline shift or mass effect. Small vessel chronic ischemic changes
of deep cerebral white matter. No intracranial hemorrhage, mass
lesion, evidence of acute infarction, or extra-axial fluid
collection.

Vascular: Atherosclerotic calcification of internal carotid and
vertebral arteries at skull base. No hyperdense vessels.

Skull: Intact

Sinuses/Orbits: Clear

Other: N/A
IMPRESSION: Atrophy with small vessel chronic ischemic changes of deep cerebral
white matter.

No acute intracranial abnormalities.
